# Patient Record
Sex: Male | Born: 1950
Health system: Southern US, Community
[De-identification: ages and names within clinical notes are randomized; demographics above are authoritative.]

## PROBLEM LIST (undated history)

## (undated) DIAGNOSIS — F1010 Alcohol abuse, uncomplicated: Secondary | ICD-10-CM

## (undated) DIAGNOSIS — F1410 Cocaine abuse, uncomplicated: Secondary | ICD-10-CM

## (undated) DIAGNOSIS — F419 Anxiety disorder, unspecified: Secondary | ICD-10-CM

## (undated) DIAGNOSIS — F1020 Alcohol dependence, uncomplicated: Secondary | ICD-10-CM

## (undated) DIAGNOSIS — I10 Essential (primary) hypertension: Secondary | ICD-10-CM

## (undated) DIAGNOSIS — F32A Depression, unspecified: Secondary | ICD-10-CM

## (undated) DIAGNOSIS — F329 Major depressive disorder, single episode, unspecified: Secondary | ICD-10-CM

---

## 2004-10-22 ENCOUNTER — Emergency Department (HOSPITAL_COMMUNITY): Admission: EM | Admit: 2004-10-22 | Discharge: 2004-10-23 | Payer: Self-pay | Admitting: Emergency Medicine

## 2005-02-05 ENCOUNTER — Emergency Department (HOSPITAL_COMMUNITY): Admission: EM | Admit: 2005-02-05 | Discharge: 2005-02-05 | Payer: Self-pay | Admitting: Emergency Medicine

## 2005-02-20 ENCOUNTER — Ambulatory Visit: Payer: Self-pay | Admitting: Family Medicine

## 2005-02-21 ENCOUNTER — Ambulatory Visit: Payer: Self-pay | Admitting: *Deleted

## 2005-09-30 ENCOUNTER — Ambulatory Visit: Payer: Self-pay | Admitting: Nurse Practitioner

## 2005-10-08 ENCOUNTER — Ambulatory Visit: Payer: Self-pay | Admitting: Internal Medicine

## 2005-11-11 ENCOUNTER — Ambulatory Visit: Payer: Self-pay | Admitting: Internal Medicine

## 2006-09-17 ENCOUNTER — Emergency Department (HOSPITAL_COMMUNITY): Admission: EM | Admit: 2006-09-17 | Discharge: 2006-09-17 | Payer: Self-pay | Admitting: Emergency Medicine

## 2007-06-17 ENCOUNTER — Emergency Department (HOSPITAL_COMMUNITY): Admission: EM | Admit: 2007-06-17 | Discharge: 2007-06-17 | Payer: Self-pay | Admitting: Emergency Medicine

## 2007-07-27 ENCOUNTER — Inpatient Hospital Stay (HOSPITAL_COMMUNITY): Admission: EM | Admit: 2007-07-27 | Discharge: 2007-07-29 | Payer: Self-pay | Admitting: Emergency Medicine

## 2009-02-18 ENCOUNTER — Emergency Department (HOSPITAL_COMMUNITY): Admission: EM | Admit: 2009-02-18 | Discharge: 2009-02-18 | Payer: Self-pay | Admitting: Emergency Medicine

## 2009-05-10 ENCOUNTER — Emergency Department (HOSPITAL_COMMUNITY): Admission: EM | Admit: 2009-05-10 | Discharge: 2009-05-10 | Payer: Self-pay | Admitting: Emergency Medicine

## 2009-05-17 ENCOUNTER — Emergency Department (HOSPITAL_COMMUNITY): Admission: EM | Admit: 2009-05-17 | Discharge: 2009-05-17 | Payer: Self-pay | Admitting: Emergency Medicine

## 2009-07-18 ENCOUNTER — Emergency Department (HOSPITAL_COMMUNITY): Admission: EM | Admit: 2009-07-18 | Discharge: 2009-07-18 | Payer: Self-pay | Admitting: Emergency Medicine

## 2010-08-20 ENCOUNTER — Emergency Department (HOSPITAL_COMMUNITY): Admission: EM | Admit: 2010-08-20 | Discharge: 2010-08-20 | Payer: Self-pay | Admitting: Emergency Medicine

## 2011-02-27 LAB — DIFFERENTIAL
Lymphocytes Relative: 12 % (ref 12–46)
Lymphs Abs: 1.5 10*3/uL (ref 0.7–4.0)
Monocytes Relative: 8 % (ref 3–12)
Neutrophils Relative %: 80 % — ABNORMAL HIGH (ref 43–77)

## 2011-02-27 LAB — CBC
Hemoglobin: 13.1 g/dL (ref 13.0–17.0)
Platelets: 237 10*3/uL (ref 150–400)
RBC: 4.74 MIL/uL (ref 4.22–5.81)
WBC: 12.1 10*3/uL — ABNORMAL HIGH (ref 4.0–10.5)

## 2011-02-27 LAB — RAPID URINE DRUG SCREEN, HOSP PERFORMED
Amphetamines: NOT DETECTED
Barbiturates: NOT DETECTED
Benzodiazepines: NOT DETECTED
Cocaine: POSITIVE — AB
Opiates: NOT DETECTED

## 2011-02-27 LAB — BASIC METABOLIC PANEL
Calcium: 9.2 mg/dL (ref 8.4–10.5)
Chloride: 102 mEq/L (ref 96–112)
Creatinine, Ser: 0.91 mg/dL (ref 0.4–1.5)
GFR calc Af Amer: >60 mL/min (ref >60)
Sodium: 139 mEq/L (ref 135–145)

## 2011-02-27 LAB — ETHANOL: Alcohol, Ethyl (B): 12 mg/dL — ABNORMAL HIGH (ref 0–10)

## 2011-03-24 LAB — CBC
MCHC: 32.2 g/dL (ref 30.0–36.0)
MCV: 84.7 fL (ref 78.0–100.0)
RBC: 5.61 MIL/uL (ref 4.22–5.81)

## 2011-03-24 LAB — RAPID URINE DRUG SCREEN, HOSP PERFORMED
Amphetamines: NOT DETECTED
Barbiturates: NOT DETECTED
Benzodiazepines: NOT DETECTED
Cocaine: POSITIVE — AB

## 2011-03-24 LAB — BASIC METABOLIC PANEL
BUN: 11 mg/dL (ref 6–23)
CO2: 25 mEq/L (ref 19–32)
Chloride: 103 mEq/L (ref 96–112)
Creatinine, Ser: 1.13 mg/dL (ref 0.4–1.5)
Glucose, Bld: 140 mg/dL — ABNORMAL HIGH (ref 70–99)

## 2011-03-24 LAB — DIFFERENTIAL
Basophils Absolute: 0 10*3/uL (ref 0.0–0.1)
Eosinophils Relative: 1 % (ref 0–5)
Lymphocytes Relative: 22 % (ref 12–46)
Lymphs Abs: 1.8 10*3/uL (ref 0.7–4.0)
Monocytes Absolute: 0.5 10*3/uL (ref 0.1–1.0)
Neutro Abs: 5.9 10*3/uL (ref 1.7–7.7)

## 2011-03-25 LAB — BASIC METABOLIC PANEL
BUN: 13 mg/dL (ref 6–23)
Chloride: 111 mEq/L (ref 96–112)
Creatinine, Ser: 1.22 mg/dL (ref 0.4–1.5)
Glucose, Bld: 120 mg/dL — ABNORMAL HIGH (ref 70–99)
Potassium: 4.2 mEq/L (ref 3.5–5.1)

## 2011-03-25 LAB — CBC
HCT: 40.7 % (ref 39.0–52.0)
MCHC: 32.5 g/dL (ref 30.0–36.0)
MCV: 84.6 fL (ref 78.0–100.0)
Platelets: 238 10*3/uL (ref 150–400)
RDW: 14.7 % (ref 11.5–15.5)
WBC: 6.8 10*3/uL (ref 4.0–10.5)

## 2011-03-25 LAB — DIFFERENTIAL
Basophils Absolute: 0 10*3/uL (ref 0.0–0.1)
Basophils Relative: 0 % (ref 0–1)
Eosinophils Absolute: 0 10*3/uL (ref 0.0–0.7)
Eosinophils Relative: 1 % (ref 0–5)
Lymphs Abs: 1.4 10*3/uL (ref 0.7–4.0)
Neutrophils Relative %: 72 % (ref 43–77)

## 2011-03-25 LAB — RAPID URINE DRUG SCREEN, HOSP PERFORMED
Benzodiazepines: NOT DETECTED
Cocaine: POSITIVE — AB
Tetrahydrocannabinol: NOT DETECTED

## 2011-03-25 LAB — ETHANOL: Alcohol, Ethyl (B): <5 mg/dL (ref 0–10)

## 2011-03-25 LAB — TRICYCLICS SCREEN, URINE: TCA Scrn: NOT DETECTED

## 2011-03-27 LAB — BASIC METABOLIC PANEL
BUN: 10 mg/dL (ref 6–23)
Chloride: 102 mEq/L (ref 96–112)
Creatinine, Ser: 0.93 mg/dL (ref 0.4–1.5)
GFR calc non Af Amer: >60 mL/min (ref >60)
Glucose, Bld: 90 mg/dL (ref 70–99)

## 2011-03-27 LAB — DIFFERENTIAL
Basophils Absolute: 0 10*3/uL (ref 0.0–0.1)
Eosinophils Absolute: 0.1 10*3/uL (ref 0.0–0.7)
Lymphs Abs: 2.1 10*3/uL (ref 0.7–4.0)
Neutrophils Relative %: 73 % (ref 43–77)

## 2011-03-27 LAB — CBC
MCV: 84.4 fL (ref 78.0–100.0)
Platelets: 230 10*3/uL (ref 150–400)
RDW: 13.9 % (ref 11.5–15.5)
WBC: 11.8 10*3/uL — ABNORMAL HIGH (ref 4.0–10.5)
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
## 2011-08-18 ENCOUNTER — Emergency Department (HOSPITAL_COMMUNITY)
Admission: EM | Admit: 2011-08-18 | Discharge: 2011-08-18 | Disposition: A | Discharge status: Home / Self Care | Payer: Self-pay | Attending: Emergency Medicine | Admitting: Emergency Medicine

## 2011-08-18 ENCOUNTER — Emergency Department (HOSPITAL_COMMUNITY): Payer: Self-pay

## 2011-08-18 DIAGNOSIS — K6289 Other specified diseases of anus and rectum: Secondary | ICD-10-CM | POA: Insufficient documentation

## 2011-08-18 DIAGNOSIS — R109 Unspecified abdominal pain: Secondary | ICD-10-CM | POA: Insufficient documentation

## 2011-08-18 DIAGNOSIS — K644 Residual hemorrhoidal skin tags: Secondary | ICD-10-CM | POA: Insufficient documentation

## 2011-08-18 DIAGNOSIS — K625 Hemorrhage of anus and rectum: Secondary | ICD-10-CM | POA: Insufficient documentation

## 2011-08-18 DIAGNOSIS — K5900 Constipation, unspecified: Secondary | ICD-10-CM | POA: Insufficient documentation

## 2011-08-18 LAB — OCCULT BLOOD, POC DEVICE: Fecal Occult Bld: NEGATIVE

## 2011-09-29 LAB — DIFFERENTIAL
Basophils Absolute: 0
Basophils Relative: 0
Eosinophils Relative: 1
Monocytes Absolute: 0.6
Monocytes Relative: 9

## 2011-09-29 LAB — CBC
HCT: 39
Platelets: 235
RDW: 14.1 — ABNORMAL HIGH
WBC: 6.8

## 2011-09-29 LAB — COMPREHENSIVE METABOLIC PANEL
ALT: 21
AST: 29
Albumin: 3.2 — ABNORMAL LOW
Alkaline Phosphatase: 47
BUN: 7
Chloride: 103
GFR calc Af Amer: >60
Potassium: 3.7
Sodium: 139
Total Bilirubin: 0.4
Total Protein: 7.1

## 2011-09-29 LAB — APTT: aPTT: 27

## 2012-06-28 ENCOUNTER — Emergency Department (HOSPITAL_COMMUNITY)
Admission: EM | Admit: 2012-06-28 | Discharge: 2012-06-28 | Disposition: A | Discharge status: Home / Self Care | Payer: Self-pay | Attending: Emergency Medicine | Admitting: Emergency Medicine

## 2012-06-28 ENCOUNTER — Encounter (HOSPITAL_COMMUNITY): Payer: Self-pay | Admitting: Emergency Medicine

## 2012-06-28 ENCOUNTER — Emergency Department (HOSPITAL_COMMUNITY): Payer: Self-pay

## 2012-06-28 DIAGNOSIS — Y9289 Other specified places as the place of occurrence of the external cause: Secondary | ICD-10-CM | POA: Insufficient documentation

## 2012-06-28 DIAGNOSIS — W19XXXA Unspecified fall, initial encounter: Secondary | ICD-10-CM | POA: Insufficient documentation

## 2012-06-28 DIAGNOSIS — S2231XA Fracture of one rib, right side, initial encounter for closed fracture: Secondary | ICD-10-CM

## 2012-06-28 DIAGNOSIS — F17200 Nicotine dependence, unspecified, uncomplicated: Secondary | ICD-10-CM | POA: Insufficient documentation

## 2012-06-28 DIAGNOSIS — R079 Chest pain, unspecified: Secondary | ICD-10-CM | POA: Insufficient documentation

## 2012-06-28 MED ORDER — HYDROCODONE-ACETAMINOPHEN 5-500 MG PO TABS: 1.0000 | ORAL_TABLET | Freq: Four times a day (QID) | ORAL | Status: AC | PRN

## 2012-06-28 MED ORDER — HYDROCODONE-ACETAMINOPHEN 5-325 MG PO TABS
2.0000 | ORAL_TABLET | Freq: Once | ORAL | Status: AC
  Administered 2012-06-28: 2 via ORAL
  Filled 2012-06-28: qty 2

## 2012-06-28 NOTE — ED Provider Notes (Signed)
History    This chart was scribed for Suzi Roots, MD, MD by Smitty Pluck. The patient was seen in room TR05C and the patient's care was started at 3:12PM.   CSN: [id number]  Arrival date & time 06/28/12  1426   None     Chief Complaint  Patient presents with  . Fall    (Consider location/radiation/quality/duration/timing/severity/associated sxs/prior treatment) Patient is a 61 y.o. male presenting with fall. The history is provided by the patient.  Fall Pertinent negatives include no fever, no numbness, no abdominal pain, no nausea, no vomiting and no headaches.   Ryan Herring is a 61 y.o. male who presents to the Emergency Department complaining of constant moderate right rib pain onset 2 days ago after falling in parking lot. Constant, dull, worse w palpation and movement. Denies neck and back pain. Denies abdominal pain. No nv. No sob. Pt denies taking medication PTA. Denies head injury or LOC. Denies radiation. Pt states trip and fall in parking lot 2 nights ago. No loc. No faintness or dizziness.   History reviewed. No pertinent past medical history.  History reviewed. No pertinent past surgical history.  History reviewed. No pertinent family history.  History  Substance Use Topics  . Smoking status: Current Everyday Smoker  . Smokeless tobacco: Not on file  . Alcohol Use: Yes      Review of Systems  Constitutional: Negative for fever and chills.  HENT: Negative for neck pain.   Respiratory: Negative for cough and shortness of breath.   Gastrointestinal: Negative for nausea, vomiting and abdominal pain.  Genitourinary: Negative for flank pain.  Musculoskeletal: Negative for back pain.  Skin: Negative for wound.  Neurological: Negative for weakness, numbness and headaches.    Allergies  Review of patient's allergies indicates no known allergies.  Home Medications  No current outpatient prescriptions on file.  BP 132/90  Pulse 77  Temp 98 F (36.7 C)  (Oral)  Resp 18  SpO2 96%  Physical Exam  Nursing note and vitals reviewed. Constitutional: He is oriented to person, place, and time. He appears well-developed and well-nourished. No distress.  HENT:  Head: Normocephalic and atraumatic.  Eyes: Conjunctivae are normal.  Neck: Neck supple. No tracheal deviation present.  Cardiovascular: Normal rate, regular rhythm, normal heart sounds and intact distal pulses.   Pulmonary/Chest: Effort normal and breath sounds normal. No respiratory distress. He exhibits tenderness (left lateral ribs).       Right lateral chest wall tenderness. No crepitus.   Abdominal: Soft. Bowel sounds are normal. He exhibits no distension. There is no tenderness.       No abd wall contusion or bruising noted.   Musculoskeletal: Normal range of motion. He exhibits no edema and no tenderness.       CTLS spine, non tender, aligned, no step off.   Neurological: He is alert and oriented to person, place, and time.       Steady gait.   Skin: Skin is warm and dry.  Psychiatric: He has a normal mood and affect. His behavior is normal.    ED Course  Procedures (including critical care time) DIAGNOSTIC STUDIES: Oxygen Saturation is 96% on room air, normal by my interpretation.    COORDINATION OF CARE: 3:22PM EDP ordered medication: norco 325 mg   Results for orders placed during the hospital encounter of 08/18/11  OCCULT BLOOD, POC DEVICE      Component Value Range   Fecal Occult Bld NEGATIVE  Dg Ribs Unilateral W/chest Right  06/28/2012  *RADIOLOGY REPORT*  Clinical Data: Fall with right rib pain.  RIGHT RIBS AND CHEST - 3+ VIEW  Comparison: 02/18/2009.  Findings: Trachea is midline.  Heart is enlarged, stable.  Minimal left basilar scarring.  No pleural fluid.  Two views of the right ribs show a nondisplaced fracture of the right 9th anterolateral rib.  IMPRESSION: Nondisplaced right 9th anterolateral rib fracture.  Original Report Authenticated By: Reyes Ivan, M.D.       MDM  I personally performed the services described in this documentation, which was scribed in my presence. The recorded information has been reviewed and considered. Suzi Roots, MD  Pt says took bus, does not have to drive. No meds pta. vicodin po. Xrays.       Suzi Roots, MD 06/28/12 [phone]

## 2012-06-28 NOTE — ED Notes (Signed)
Pt c/o right rib pain after falling on Saturday night; pt denies other injury

## 2012-10-27 ENCOUNTER — Encounter (HOSPITAL_BASED_OUTPATIENT_CLINIC_OR_DEPARTMENT_OTHER): Payer: Self-pay | Admitting: Family Medicine

## 2012-10-27 ENCOUNTER — Emergency Department (HOSPITAL_BASED_OUTPATIENT_CLINIC_OR_DEPARTMENT_OTHER)
Admission: EM | Admit: 2012-10-27 | Discharge: 2012-10-27 | Disposition: A | Discharge status: Home / Self Care | Payer: Self-pay | Attending: Emergency Medicine | Admitting: Emergency Medicine

## 2012-10-27 DIAGNOSIS — F17200 Nicotine dependence, unspecified, uncomplicated: Secondary | ICD-10-CM | POA: Insufficient documentation

## 2012-10-27 DIAGNOSIS — Z79899 Other long term (current) drug therapy: Secondary | ICD-10-CM | POA: Insufficient documentation

## 2012-10-27 DIAGNOSIS — R350 Frequency of micturition: Secondary | ICD-10-CM | POA: Insufficient documentation

## 2012-10-27 LAB — COMPREHENSIVE METABOLIC PANEL
Alkaline Phosphatase: 60 U/L (ref 39–117)
BUN: 14 mg/dL (ref 6–23)
Chloride: 103 mEq/L (ref 96–112)
Creatinine, Ser: 0.9 mg/dL (ref 0.50–1.35)
GFR calc Af Amer: >90 mL/min (ref >90)
GFR calc non Af Amer: >90 mL/min (ref >90)
Glucose, Bld: 120 mg/dL — ABNORMAL HIGH (ref 70–99)
Potassium: 3.8 mEq/L (ref 3.5–5.1)
Total Bilirubin: 0.1 mg/dL — ABNORMAL LOW (ref 0.3–1.2)

## 2012-10-27 LAB — URINALYSIS, ROUTINE W REFLEX MICROSCOPIC
Bilirubin Urine: NEGATIVE
Glucose, UA: NEGATIVE mg/dL
Ketones, ur: NEGATIVE mg/dL
Leukocytes, UA: NEGATIVE
Nitrite: NEGATIVE
Specific Gravity, Urine: 1.026 (ref 1.005–1.030)
pH: 5 (ref 5.0–8.0)

## 2012-10-27 LAB — CBC WITH DIFFERENTIAL/PLATELET
HCT: 34.4 % — ABNORMAL LOW (ref 39.0–52.0)
Hemoglobin: 11.2 g/dL — ABNORMAL LOW (ref 13.0–17.0)
Lymphs Abs: 1.6 10*3/uL (ref 0.7–4.0)
MCH: 26.7 pg (ref 26.0–34.0)
MCHC: 32.6 g/dL (ref 30.0–36.0)
Monocytes Absolute: 0.7 10*3/uL (ref 0.1–1.0)
Monocytes Relative: 11 % (ref 3–12)
Neutro Abs: 3.9 10*3/uL (ref 1.7–7.7)
Neutrophils Relative %: 62 % (ref 43–77)
RBC: 4.2 MIL/uL — ABNORMAL LOW (ref 4.22–5.81)

## 2012-10-27 MED ORDER — PREDNISONE 10 MG PO TABS: 20.0000 mg | ORAL_TABLET | Freq: Two times a day (BID) | ORAL | Status: DC

## 2012-10-27 NOTE — ED Provider Notes (Signed)
History     CSN: [id number]  Arrival date & time 10/27/12  1310   First MD Initiated Contact with Patient 10/27/12 1331      Chief Complaint  Patient presents with  . Urinary Frequency    (Consider location/radiation/quality/duration/timing/severity/associated sxs/prior treatment) HPI Comments: Patient is at [facility] for treatment of drugs and alcohol abuse.  He presents here with several complaints.  He says he is having urinary frequency without dysuria and is having to get up several times per night to urinate.  No fevers or chills.  He is also been unable to achieve orgasm while he has been at [facility].  He is on Prozac but tells me he has been on this for several years.  He is also having sores on his skin that itch.  He has had these for many years as well.    Patient is a 61 y.o. male presenting with frequency. The history is provided by the patient.  Urinary Frequency This is a new problem. Episode onset: several weeks ago. The problem occurs constantly. The problem has not changed since onset.Pertinent negatives include no chest pain and no abdominal pain. Nothing aggravates the symptoms. Nothing relieves the symptoms. He has tried nothing for the symptoms.    History reviewed. No pertinent past medical history.  History reviewed. No pertinent past surgical history.  No family history on file.  History  Substance Use Topics  . Smoking status: Current Every Day Smoker  . Smokeless tobacco: Not on file  . Alcohol Use: Yes     Comment: in rehab      Review of Systems  Cardiovascular: Negative for chest pain.  Gastrointestinal: Negative for abdominal pain.  Genitourinary: Positive for frequency.  All other systems reviewed and are negative.    Allergies  Review of patient's allergies indicates no known allergies.  Home Medications   Current Outpatient Rx  Name  Route  Sig  Dispense  Refill  . FLUOXETINE HCL 20 MG PO TABS   Oral   Take 60 mg by mouth daily.         . TRAZODONE HCL 100 MG PO TABS   Oral   Take 100 mg by mouth at bedtime.           BP 126/83  Pulse 75  Temp 97.6 F (36.4 C) (Oral)  Resp 16  Ht 6\' 5"  (1.956 m)  Wt 228 lb (103.42 kg)  BMI 27.04 kg/m2  SpO2 98%  Physical Exam  Nursing note and vitals reviewed. Constitutional: He is oriented to person, place, and time. He appears well-developed and well-nourished. No distress.  HENT:  Head: Normocephalic and atraumatic.  Mouth/Throat: Oropharynx is clear and moist.  Neck: Normal range of motion. Neck supple.  Cardiovascular: Normal rate and regular rhythm.   No murmur heard. Pulmonary/Chest: Effort normal and breath sounds normal. No respiratory distress.  Abdominal: Soft. Bowel sounds are normal. He exhibits no distension. There is no tenderness.  Musculoskeletal: Normal range of motion.  Neurological: He is alert and oriented to person, place, and time.  Skin: Skin is warm and dry. He is not diaphoretic.       There are multiple excoriated areas with dry, flaking skin to the arms and legs.      ED Course  Procedures (including critical care time)   Labs Reviewed  URINALYSIS, ROUTINE W REFLEX MICROSCOPIC  CBC WITH DIFFERENTIAL  COMPREHENSIVE METABOLIC PANEL   No results found.   No diagnosis found.  MDM  The labs do not reveal elevated blood sugar.  There is no uti.  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  Will treat with prednisone.  As far as the anorgasmia goes, this could be related to prozac.  He really needs a pcp to discuss these issues.  He may also have some prostate issues that should be worked up in the office.          Geoffery Lyons, MD 10/27/12 [phone]

## 2012-10-27 NOTE — ED Notes (Signed)
Pt is in [facility]. Pt c/o having to get up 3-4 times at night to urinate and rash to bilateral lower extremities for "over a year". Pt also sts "when I'm making love, I can't get satisfied".

## 2013-11-10 ENCOUNTER — Emergency Department (HOSPITAL_COMMUNITY): Payer: Self-pay

## 2013-11-10 ENCOUNTER — Emergency Department (HOSPITAL_COMMUNITY)
Admission: EM | Admit: 2013-11-10 | Discharge: 2013-11-10 | Disposition: A | Discharge status: Home / Self Care | Payer: Self-pay | Attending: Emergency Medicine | Admitting: Emergency Medicine

## 2013-11-10 ENCOUNTER — Encounter (HOSPITAL_COMMUNITY): Payer: Self-pay | Admitting: Emergency Medicine

## 2013-11-10 DIAGNOSIS — R072 Precordial pain: Secondary | ICD-10-CM | POA: Insufficient documentation

## 2013-11-10 DIAGNOSIS — I10 Essential (primary) hypertension: Secondary | ICD-10-CM | POA: Insufficient documentation

## 2013-11-10 DIAGNOSIS — F17200 Nicotine dependence, unspecified, uncomplicated: Secondary | ICD-10-CM | POA: Insufficient documentation

## 2013-11-10 DIAGNOSIS — R079 Chest pain, unspecified: Secondary | ICD-10-CM

## 2013-11-10 DIAGNOSIS — Z79899 Other long term (current) drug therapy: Secondary | ICD-10-CM | POA: Insufficient documentation

## 2013-11-10 LAB — BASIC METABOLIC PANEL
CO2: 28 mEq/L (ref 19–32)
Chloride: 102 mEq/L (ref 96–112)
Creatinine, Ser: 0.81 mg/dL (ref 0.50–1.35)
GFR calc Af Amer: >90 mL/min (ref >90)
Potassium: 3.7 mEq/L (ref 3.5–5.1)
Sodium: 137 mEq/L (ref 135–145)

## 2013-11-10 LAB — CBC
Platelets: 260 10*3/uL (ref 150–400)
RBC: 4.4 MIL/uL (ref 4.22–5.81)
RDW: 14 % (ref 11.5–15.5)
WBC: 7.9 10*3/uL (ref 4.0–10.5)

## 2013-11-10 LAB — POCT I-STAT TROPONIN I: Troponin i, poc: 0.01 ng/mL (ref 0.00–0.08)

## 2013-11-10 MED ORDER — HYDROCHLOROTHIAZIDE 25 MG PO TABS: 25.0000 mg | ORAL_TABLET | Freq: Every day | ORAL | Status: DC

## 2013-11-10 MED ORDER — OXYCODONE-ACETAMINOPHEN 5-325 MG PO TABS
1.0000 | ORAL_TABLET | Freq: Once | ORAL | Status: AC
  Administered 2013-11-10: 1 via ORAL
  Filled 2013-11-10: qty 1

## 2013-11-10 NOTE — ED Provider Notes (Signed)
I saw and evaluated the patient, reviewed the resident's note and I agree with the findings and plan.  EKG Interpretation    Date/Time:  Thursday November 10 2013 19:49:03 EST Ventricular Rate:  74 PR Interval:  176 QRS Duration: 86 QT Interval:  432 QTC Calculation: 479 R Axis:   -26 Text Interpretation:  Sinus rhythm Atrial premature complexes Left ventricular hypertrophy Borderline prolonged QT interval No significant change since last tracing Confirmed by Anitra Lauth  MD, Jaquila Santelli ([id number]) on 11/10/2013 8:03:57 PM            Pt with atypical story for CP.  Only risk factor HTN.  No recent cocaine use.  Very low risk for PE.  States pain startes when he drinks beer.  EKG wnl.  CXR, CBC, BMP, troponin neg (after 9 hours of pain).  No improvement with NTG from EMS which is not suprising due to this does not sound like cardiac CP.  Improvement after percocet and pt d/ced home with med refill.   Gwyneth Sprout, MD 11/10/13 2250

## 2013-11-10 NOTE — ED Notes (Signed)
Patient with chest pain that started this morning.  Patient was given 324mg  of ASA and 2 SL nitro en route to ED.  Patient continues with pain, 6/10 after nitro.  Patient denies any shortness of breath.

## 2013-11-10 NOTE — ED Notes (Signed)
Patient removed IV

## 2013-11-10 NOTE — ED Notes (Signed)
Pt states the stretcher is not comfortable, pt informed there are only stretchers in the ED. Bed readjusted and pt given extra pillow.

## 2013-11-10 NOTE — ED Provider Notes (Signed)
CSN: [id number]     Arrival date & time 11/10/13  1938 History   First MD Initiated Contact with Patient 11/10/13 1947     Chief Complaint  Patient presents with  . Chest Pain   (Consider location/radiation/quality/duration/timing/severity/associated sxs/prior Treatment) HPI Comments: 62 year old male with chest pain. States this began at 12:00 PM. 7 hours prior to arrival. Has been a constant pain. This is a pressure on the right side of his chest. Patient states this did come on when he got very nervous.Minimal relief with nitroglycerin. Patient denies any shortness of breath diaphoresis. Patient is nondiabetic has no known hyperlipidemia. Previous diagnosed hypertension. No previous heart issues. No family history.  Patient is a 62 y.o. male presenting with chest pain. The history is provided by the patient.  Chest Pain Pain location:  Substernal area and R chest Pain quality: aching   Pain radiates to:  Does not radiate Pain radiates to the back: no   Pain severity:  Moderate Onset quality:  Unable to specify Duration:  6 hours Timing:  Constant Progression:  Partially resolved Chronicity:  New Context: at rest   Relieved by:  Nothing Worsened by:  Nothing tried Ineffective treatments:  Nitroglycerin and aspirin Associated symptoms: no abdominal pain, no back pain, no fatigue, no headache and no shortness of breath     History reviewed. No pertinent past medical history. History reviewed. No pertinent past surgical history. No family history on file. History  Substance Use Topics  . Smoking status: Current Every Day Smoker  . Smokeless tobacco: Not on file  . Alcohol Use: Yes     Comment: in rehab    Review of Systems  Constitutional: Negative for fatigue.  Respiratory: Negative for shortness of breath.   Cardiovascular: Positive for chest pain.  Gastrointestinal: Negative for abdominal pain.  Genitourinary: Negative for dysuria.  Musculoskeletal: Negative for back  pain.  Skin: Negative for rash.  Neurological: Negative for headaches.  Psychiatric/Behavioral: Negative for agitation.  All other systems reviewed and are negative.    Allergies  Review of patient's allergies indicates no known allergies.  Home Medications   Current Outpatient Rx  Name  Route  Sig  Dispense  Refill  . ARIPiprazole (ABILIFY) 20 MG tablet   Oral   Take 20 mg by mouth at bedtime.         . hydrochlorothiazide (HYDRODIURIL) 25 MG tablet   Oral   Take 1 tablet (25 mg total) by mouth daily.   30 tablet   1    BP 114/77  Pulse 71  Temp(Src) 98.2 F (36.8 C) (Oral)  Resp 14  SpO2 96% Physical Exam  Nursing note and vitals reviewed. Constitutional: He is oriented to person, place, and time. He appears well-developed and well-nourished.  HENT:  Head: Normocephalic and atraumatic.  Eyes: EOM are normal. Pupils are equal, round, and reactive to light.  Neck: Normal range of motion.  Cardiovascular: Normal rate, regular rhythm, normal heart sounds and intact distal pulses.   No murmur heard. Pulmonary/Chest: Effort normal and breath sounds normal. No respiratory distress. He exhibits no tenderness.  Abdominal: Soft. He exhibits no distension. There is no tenderness.  Musculoskeletal: Normal range of motion.  Neurological: He is alert and oriented to person, place, and time. No cranial nerve deficit. He exhibits normal muscle tone. Coordination normal.  Skin: Skin is warm and dry. No rash noted.  Psychiatric: He has a normal mood and affect.    ED Course  Procedures (including  critical care time) Labs Review Labs Reviewed  CBC - Abnormal; Notable for the following:    Hemoglobin 12.0 (*)    HCT 36.2 (*)    All other components within normal limits  BASIC METABOLIC PANEL  POCT I-STAT TROPONIN I   Imaging Review Dg Chest 2 View  11/10/2013   CLINICAL DATA:  Right-sided chest pain  EXAM: CHEST  2 VIEW  COMPARISON:  Prior radiograph from 06/28/2012   FINDINGS: The cardiac and mediastinal silhouettes are stable in size and contour, and remain within normal limits.  The lungs are normally inflated. No airspace consolidation, pleural effusion, or pulmonary edema is identified. There is no pneumothorax.  No acute osseous abnormality identified.  IMPRESSION: No active cardiopulmonary disease.   Electronically Signed   By: Rise Mu M.D.   On: 11/10/2013 21:56    EKG Interpretation    Date/Time:  Thursday November 10 2013 19:49:03 EST Ventricular Rate:  74 PR Interval:  176 QRS Duration: 86 QT Interval:  432 QTC Calculation: 479 R Axis:   -26 Text Interpretation:  Sinus rhythm Atrial premature complexes Left ventricular hypertrophy Borderline prolonged QT interval No significant change since last tracing Confirmed by Anitra Lauth  MD, WHITNEY ([id number]) on 11/10/2013 8:03:57 PM            MDM   1. Chest pain    62 year old male arrives with chest pain. Patient received nitroglycerin and aspirin 325 with EMS. Minimal relief of symptoms. Patient signs symptoms not consistent with cardiac chest pain. Pain is right-sided. Not relieved with nitroglycerin. EKG findings above. However no concerning signs of ischemia. Initial troponin negative. Patient states he often gets this pain while drinking. Patient admits to drinking this afternoon. Chest has been present for over 6 hours, patient states has been constant for 6 hours. If cardiac expected elevated troponin 6 hours in. Vital signs stable. Patient on short of breath. No hypoxic or tachycardic. Doubt pulmonary embolism. Physical exam unremarkable. Chest x-ray showed no signs of infectious cause. Pain not consistent with aortic pathology. Believe pt appropriate for discharge. Pt given strict return precautions for worsening pain, SOB, diaphoresis etc. Pt voices understanding. Patient also with a history of hypertension. Has not been taking his medications for several months does not have  refills. Will provide prescription for hydrochlorothiazide.  Patient discussed with attending Dr. Anitra Lauth.      Bridgett Larsson, MD 11/10/13 2245

## 2013-11-29 ENCOUNTER — Emergency Department (HOSPITAL_COMMUNITY)
Admission: EM | Admit: 2013-11-29 | Discharge: 2013-11-29 | Disposition: A | Discharge status: Home / Self Care | Payer: Self-pay | Attending: Emergency Medicine | Admitting: Emergency Medicine

## 2013-11-29 ENCOUNTER — Encounter (HOSPITAL_COMMUNITY): Payer: Self-pay | Admitting: Emergency Medicine

## 2013-11-29 DIAGNOSIS — R404 Transient alteration of awareness: Secondary | ICD-10-CM | POA: Insufficient documentation

## 2013-11-29 DIAGNOSIS — K117 Disturbances of salivary secretion: Secondary | ICD-10-CM | POA: Insufficient documentation

## 2013-11-29 DIAGNOSIS — Z79899 Other long term (current) drug therapy: Secondary | ICD-10-CM | POA: Insufficient documentation

## 2013-11-29 DIAGNOSIS — F329 Major depressive disorder, single episode, unspecified: Secondary | ICD-10-CM | POA: Insufficient documentation

## 2013-11-29 DIAGNOSIS — F3289 Other specified depressive episodes: Secondary | ICD-10-CM | POA: Insufficient documentation

## 2013-11-29 DIAGNOSIS — I10 Essential (primary) hypertension: Secondary | ICD-10-CM | POA: Insufficient documentation

## 2013-11-29 DIAGNOSIS — F411 Generalized anxiety disorder: Secondary | ICD-10-CM | POA: Insufficient documentation

## 2013-11-29 DIAGNOSIS — F17200 Nicotine dependence, unspecified, uncomplicated: Secondary | ICD-10-CM | POA: Insufficient documentation

## 2013-11-29 DIAGNOSIS — F419 Anxiety disorder, unspecified: Secondary | ICD-10-CM

## 2013-11-29 HISTORY — DX: Anxiety disorder, unspecified: F41.9

## 2013-11-29 HISTORY — DX: Essential (primary) hypertension: I10

## 2013-11-29 HISTORY — DX: Major depressive disorder, single episode, unspecified: F32.9

## 2013-11-29 HISTORY — DX: Depression, unspecified: F32.A

## 2013-11-29 MED ORDER — ALPRAZOLAM 0.5 MG PO TABS
0.5000 mg | ORAL_TABLET | Freq: Once | ORAL | Status: AC
  Administered 2013-11-29: 0.5 mg via ORAL
  Filled 2013-11-29: qty 1

## 2013-11-29 MED ORDER — ALPRAZOLAM 0.5 MG PO TABS: 0.5000 mg | ORAL_TABLET | Freq: Every evening | ORAL | Status: DC | PRN

## 2013-11-29 NOTE — ED Provider Notes (Signed)
CSN: [id number]     Arrival date & time 11/29/13  1310 History  This chart was scribed for non-physician practitioner Irish Elders, NP working with Ross Marcus, MD by Danella Maiers, ED Scribe. This patient was seen in room WTR8/WTR8 and the patient's care was started at 3:32 PM.    Chief Complaint  Patient presents with  . Anxiety   The history is provided by the patient. No language interpreter was used.   HPI Comments: Ryan Herring is a 62 y.o. male brought in by ambulance with a h/o anxiety, depression who presents to the Emergency Department complaining of anxiety, dry mouth, and drowsiness onset one week ago. He takes medicine for depression  - Abilify and Lexapro, every morning. He denies changes in the doses of these medications recently. He denies fevers, recent illness, SI, HI. He states he was told by a doctor that he has an irregular heart rhythm. He is otherwise healthy.    Past Medical History  Diagnosis Date  . Anxiety   . Hypertension   . Depression    No past surgical history on file. No family history on file. History  Substance Use Topics  . Smoking status: Current Every Day Smoker  . Smokeless tobacco: Not on file  . Alcohol Use: Yes     Comment: in rehab    Review of Systems  Psychiatric/Behavioral: Negative for suicidal ideas. The patient is nervous/anxious.   All other systems reviewed and are negative.    Allergies  Review of patient's allergies indicates no known allergies.  Home Medications   Current Outpatient Rx  Name  Route  Sig  Dispense  Refill  . ARIPiprazole (ABILIFY) 20 MG tablet   Oral   Take 20 mg by mouth at bedtime.         . hydrochlorothiazide (HYDRODIURIL) 25 MG tablet   Oral   Take 1 tablet (25 mg total) by mouth daily.   30 tablet   1    BP 137/99  Pulse 77  Temp(Src) 98.2 F (36.8 C) (Oral)  Resp 14  SpO2 94% Physical Exam  Nursing note and vitals reviewed. Constitutional: He is oriented to person, place,  and time. He appears well-developed and well-nourished. No distress.  HENT:  Head: Normocephalic and atraumatic.  Eyes: EOM are normal.  Neck: Neck supple. No tracheal deviation present.  Cardiovascular: Normal rate.   No murmur heard. Irregular rhythm.   Pulmonary/Chest: Effort normal. No respiratory distress.  Musculoskeletal: Normal range of motion.  Neurological: He is alert and oriented to person, place, and time.  Skin: Skin is warm and dry.  Psychiatric: He has a normal mood and affect. His behavior is normal.    ED Course  Procedures (including critical care time) Medications  ALPRAZolam (XANAX) tablet 0.5 mg (0.5 mg Oral Given 11/29/13 1603)    DIAGNOSTIC STUDIES: Oxygen Saturation is 94% on RA, adequate by my interpretation.    COORDINATION OF CARE: 3:56 PM- Discussed treatment plan with pt which includes EKG and one dose of Xanax. Pt agrees to plan.    Labs Review Labs Reviewed - No data to display Imaging Review No results found.  EKG Interpretation   None            1. Anxiety    Pt feeling anxious today. Denies chest pain, shortness of breath or difficulty breathing. No history of recent illness or travel. Feeling better after xanax 0.5mg  given here in ER. No suicidal or homicidal ideation. No  psychosis. Pt encouraged to establsih and follow-up with PCP for management of meds. VS stable, pt feeling ok to go home.   I personally performed the services described in this documentation, which was scribed in my presence. The recorded information has been reviewed and is accurate.    Irish Elders, NP 11/30/13 [phone]

## 2013-11-29 NOTE — ED Notes (Signed)
Pt c/o anxiety x 1 wk.  Denies SI/HI.

## 2013-11-29 NOTE — Progress Notes (Signed)
[facility] spoke with patient. Patient stated that he was homeless and went to the [facility]. Provided pt with a list of primary care resources, highlighting contact information for [facility]. Also, explained to patient about the [facility] [employer].

## 2013-11-29 NOTE — ED Notes (Signed)
Per EMS, states anxiety for 3 days-homeless-EMS was called out last night for same symptoms but did not come in for eval

## 2013-11-30 NOTE — ED Provider Notes (Deleted)
CSN: [id number]     Arrival date & time 11/29/13  1310 History   First MD Initiated Contact with Patient 11/29/13 1532     Chief Complaint  Patient presents with  . Anxiety   (Consider location/radiation/quality/duration/timing/severity/associated sxs/prior Treatment) Patient is a 62 y.o. male presenting with anxiety. The history is provided by the patient. No language interpreter was used.  Anxiety This is a recurrent problem. Pertinent negatives include no abdominal pain, chest pain, chills, fatigue, fever, joint swelling or vomiting.    Past Medical History  Diagnosis Date  . Anxiety   . Hypertension   . Depression    No past surgical history on file. No family history on file. History  Substance Use Topics  . Smoking status: Current Every Day Smoker  . Smokeless tobacco: Not on file  . Alcohol Use: Yes     Comment: in rehab    Review of Systems  Constitutional: Negative for fever, chills and fatigue.  Respiratory: Negative for chest tightness and shortness of breath.   Cardiovascular: Negative for chest pain.  Gastrointestinal: Negative for vomiting and abdominal pain.  Musculoskeletal: Negative for joint swelling.  Neurological: Negative for syncope and speech difficulty.  All other systems reviewed and are negative.    Allergies  Review of patient's allergies indicates no known allergies.  Home Medications   Current Outpatient Rx  Name  Route  Sig  Dispense  Refill  . ARIPiprazole (ABILIFY) 20 MG tablet   Oral   Take 20 mg by mouth daily.          . Aspirin-Caffeine (BC FAST PAIN RELIEF ARTHRITIS) 1000-65 MG PACK   Oral   Take 1 Package by mouth 2 (two) times daily as needed (pain).         Marland Kitchen escitalopram (LEXAPRO) 10 MG tablet   Oral   Take 10 mg by mouth daily.         . hydrochlorothiazide (HYDRODIURIL) 25 MG tablet   Oral   Take 1 tablet (25 mg total) by mouth daily.   30 tablet   1   . ibuprofen (ADVIL,MOTRIN) 100 MG tablet   Oral  Take 200 mg by mouth every 6 (six) hours as needed for fever (pain).         Marland Kitchen loratadine (CLARITIN) 10 MG tablet   Oral   Take 10 mg by mouth daily.         Marland Kitchen ALPRAZolam (XANAX) 0.5 MG tablet   Oral   Take 1 tablet (0.5 mg total) by mouth at bedtime as needed for anxiety.   20 tablet   0    BP 137/99  Pulse 77  Temp(Src) 98.2 F (36.8 C) (Oral)  Resp 14  SpO2 94% Physical Exam  Nursing note and vitals reviewed. Constitutional: He is oriented to person, place, and time. He appears well-developed and well-nourished. No distress.  HENT:  Head: Normocephalic and atraumatic.  Mouth/Throat: Oropharynx is clear and moist.  Eyes: Conjunctivae and EOM are normal. Pupils are equal, round, and reactive to light.  Neck: Normal range of motion. Neck supple. No JVD present. No thyromegaly present.  Cardiovascular: Normal rate, regular rhythm, normal heart sounds and intact distal pulses.   Pulmonary/Chest: Effort normal and breath sounds normal. No respiratory distress. He has no wheezes.  Abdominal: Soft. Bowel sounds are normal. He exhibits no distension. There is no tenderness.  Musculoskeletal: Normal range of motion.  Neurological: He is alert and oriented to person, place, and time.  Skin: Skin is warm and dry.  Psychiatric: His speech is normal and behavior is normal. Judgment and thought content normal. His mood appears anxious. Thought content is not paranoid. Cognition and memory are normal. He expresses no homicidal and no suicidal ideation.    ED Course  Procedures (including critical care time) Labs Review Labs Reviewed - No data to display Imaging Review No results found.  EKG Interpretation    Date/Time:  Tuesday November 29 2013 16:09:15 EST Ventricular Rate:  73 PR Interval:  188 QRS Duration: 86 QT Interval:  403 QTC Calculation: 444 R Axis:   -40 Text Interpretation:  Sinus rhythm Abnormal R-wave progression, early transition Left ventricular hypertrophy  Confirmed by HORTON  MD, COURTNEY ([id number]) on 11/29/2013 5:20:19 PM            MDM   1. Anxiety     Pt feeling anxious today. Denies chest pain, shortness of breath or difficulty breathing. No history of recent illness or travel. Feeling better after xanax 0.5mg  given here in ER. No suicidal or homicidal ideation. No psychosis. Pt encouraged to establsih and follow-up with PCP for management of meds. VS stable, pt feeling ok to go home.       Irish Elders, NP 11/30/13 [phone]

## 2013-12-03 NOTE — ED Provider Notes (Signed)
Medical screening examination/treatment/procedure(s) were performed by non-physician practitioner and as supervising physician I was immediately available for consultation/collaboration.  Devlon Dosher T Frederick Klinger, MD 12/03/13 1630 

## 2014-01-13 ENCOUNTER — Encounter (HOSPITAL_BASED_OUTPATIENT_CLINIC_OR_DEPARTMENT_OTHER): Payer: Self-pay | Admitting: Emergency Medicine

## 2014-01-13 ENCOUNTER — Emergency Department (HOSPITAL_BASED_OUTPATIENT_CLINIC_OR_DEPARTMENT_OTHER)
Admission: EM | Admit: 2014-01-13 | Discharge: 2014-01-13 | Disposition: A | Discharge status: Home / Self Care | Payer: Self-pay | Attending: Emergency Medicine | Admitting: Emergency Medicine

## 2014-01-13 DIAGNOSIS — J069 Acute upper respiratory infection, unspecified: Secondary | ICD-10-CM

## 2014-01-13 DIAGNOSIS — F17200 Nicotine dependence, unspecified, uncomplicated: Secondary | ICD-10-CM | POA: Insufficient documentation

## 2014-01-13 DIAGNOSIS — F411 Generalized anxiety disorder: Secondary | ICD-10-CM | POA: Insufficient documentation

## 2014-01-13 DIAGNOSIS — F3289 Other specified depressive episodes: Secondary | ICD-10-CM | POA: Insufficient documentation

## 2014-01-13 DIAGNOSIS — F329 Major depressive disorder, single episode, unspecified: Secondary | ICD-10-CM | POA: Insufficient documentation

## 2014-01-13 DIAGNOSIS — J029 Acute pharyngitis, unspecified: Secondary | ICD-10-CM

## 2014-01-13 DIAGNOSIS — I10 Essential (primary) hypertension: Secondary | ICD-10-CM | POA: Insufficient documentation

## 2014-01-13 DIAGNOSIS — F1021 Alcohol dependence, in remission: Secondary | ICD-10-CM | POA: Insufficient documentation

## 2014-01-13 DIAGNOSIS — Z79899 Other long term (current) drug therapy: Secondary | ICD-10-CM | POA: Insufficient documentation

## 2014-01-13 HISTORY — DX: Alcohol dependence, uncomplicated: F10.20

## 2014-01-13 NOTE — ED Notes (Signed)
[facility] a [facility]

## 2014-01-13 NOTE — Discharge Instructions (Signed)
Cough, Adult  A cough is a reflex that helps clear your throat and airways. It can help heal the body or may be a reaction to an irritated airway. A cough may only last 2 or 3 weeks (acute) or may last more than 8 weeks (chronic).  CAUSES Acute cough:  Viral or bacterial infections. Chronic cough:  Infections.  Allergies.  Asthma.  Post-nasal drip.  Smoking.  Heartburn or acid reflux.  Some medicines.  Chronic lung problems (COPD).  Cancer. SYMPTOMS   Cough.  Fever.  Chest pain.  Increased breathing rate.  High-pitched whistling sound when breathing (wheezing).  Colored mucus that you cough up (sputum). TREATMENT   A bacterial cough may be treated with antibiotic medicine.  A viral cough must run its course and will not respond to antibiotics.  Your caregiver may recommend other treatments if you have a chronic cough. HOME CARE INSTRUCTIONS   Only take over-the-counter or prescription medicines for pain, discomfort, or fever as directed by your caregiver. Use cough suppressants only as directed by your caregiver.  Use a cold steam vaporizer or humidifier in your bedroom or home to help loosen secretions.  Sleep in a semi-upright position if your cough is worse at night.  Rest as needed.  Stop smoking if you smoke. SEEK IMMEDIATE MEDICAL CARE IF:   You have pus in your sputum.  Your cough starts to worsen.  You cannot control your cough with suppressants and are losing sleep.  You begin coughing up blood.  You have difficulty breathing.  You develop pain which is getting worse or is uncontrolled with medicine.  You have a fever. MAKE SURE YOU:   Understand these instructions.  Will watch your condition.  Will get help right away if you are not doing well or get worse. Document Released: 05/30/2011 Document Revised: 02/23/2012 Document Reviewed: 05/30/2011 ExitCare Patient Information 2014 [employer].  Upper Respiratory Infection,  Adult An upper respiratory infection (URI) is also sometimes known as the common cold. The upper respiratory tract includes the nose, sinuses, throat, trachea, and bronchi. Bronchi are the airways leading to the lungs. Most people improve within 1 week, but symptoms can last up to 2 weeks. A residual cough may last even longer.  CAUSES Many different viruses can infect the tissues lining the upper respiratory tract. The tissues become irritated and inflamed and often become very moist. Mucus production is also common. A cold is contagious. You can easily spread the virus to others by oral contact. This includes kissing, sharing a glass, coughing, or sneezing. Touching your mouth or nose and then touching a surface, which is then touched by another person, can also spread the virus. SYMPTOMS  Symptoms typically develop 1 to 3 days after you come in contact with a cold virus. Symptoms vary from person to person. They may include:  Runny nose.  Sneezing.  Nasal congestion.  Sinus irritation.  Sore throat.  Loss of voice (laryngitis).  Cough.  Fatigue.  Muscle aches.  Loss of appetite.  Headache.  Low-grade fever. DIAGNOSIS  You might diagnose your own cold based on familiar symptoms, since most people get a cold 2 to 3 times a year. Your caregiver can confirm this based on your exam. Most importantly, your caregiver can check that your symptoms are not due to another disease such as strep throat, sinusitis, pneumonia, asthma, or epiglottitis. Blood tests, throat tests, and X-rays are not necessary to diagnose a common cold, but they may sometimes be helpful   in excluding other more serious diseases. Your caregiver will decide if any further tests are required. RISKS AND COMPLICATIONS  You may be at risk for a more severe case of the common cold if you smoke cigarettes, have chronic heart disease (such as heart failure) or lung disease (such as asthma), or if you have a weakened immune  system. The very young and very old are also at risk for more serious infections. Bacterial sinusitis, middle ear infections, and bacterial pneumonia can complicate the common cold. The common cold can worsen asthma and chronic obstructive pulmonary disease (COPD). Sometimes, these complications can require emergency medical care and may be life-threatening. PREVENTION  The best way to protect against getting a cold is to practice good hygiene. Avoid oral or hand contact with people with cold symptoms. Wash your hands often if contact occurs. There is no clear evidence that vitamin C, vitamin E, echinacea, or exercise reduces the chance of developing a cold. However, it is always recommended to get plenty of rest and practice good nutrition. TREATMENT  Treatment is directed at relieving symptoms. There is no cure. Antibiotics are not effective, because the infection is caused by a virus, not by bacteria. Treatment may include:  Increased fluid intake. Sports drinks offer valuable electrolytes, sugars, and fluids.  Breathing heated mist or steam (vaporizer or shower).  Eating chicken soup or other clear broths, and maintaining good nutrition.  Getting plenty of rest.  Using gargles or lozenges for comfort.  Controlling fevers with ibuprofen or acetaminophen as directed by your caregiver.  Increasing usage of your inhaler if you have asthma. Zinc gel and zinc lozenges, taken in the first 24 hours of the common cold, can shorten the duration and lessen the severity of symptoms. Pain medicines may help with fever, muscle aches, and throat pain. A variety of non-prescription medicines are available to treat congestion and runny nose. Your caregiver can make recommendations and may suggest nasal or lung inhalers for other symptoms.  HOME CARE INSTRUCTIONS   Only take over-the-counter or prescription medicines for pain, discomfort, or fever as directed by your caregiver.  Use a warm mist humidifier  or inhale steam from a shower to increase air moisture. This may keep secretions moist and make it easier to breathe.  Drink enough water and fluids to keep your urine clear or pale yellow.  Rest as needed.  Return to work when your temperature has returned to normal or as your caregiver advises. You may need to stay home longer to avoid infecting others. You can also use a face mask and careful hand washing to prevent spread of the virus. SEEK MEDICAL CARE IF:   After the first few days, you feel you are getting worse rather than better.  You need your caregiver's advice about medicines to control symptoms.  You develop chills, worsening shortness of breath, or brown or red sputum. These may be signs of pneumonia.  You develop yellow or brown nasal discharge or pain in the face, especially when you bend forward. These may be signs of sinusitis.  You develop a fever, swollen neck glands, pain with swallowing, or white areas in the back of your throat. These may be signs of strep throat. SEEK IMMEDIATE MEDICAL CARE IF:   You have a fever.  You develop severe or persistent headache, ear pain, sinus pain, or chest pain.  You develop wheezing, a prolonged cough, cough up blood, or have a change in your usual mucus (if you have chronic   lung disease).  You develop sore muscles or a stiff neck. Document Released: 05/27/2001 Document Revised: 02/23/2012 Document Reviewed: 04/04/2011 ExitCare Patient Information 2014 [employer].  

## 2014-01-13 NOTE — ED Provider Notes (Signed)
CSN: [id number]     Arrival date & time 01/13/14  1716 History   First MD Initiated Contact with Patient 01/13/14 1727     Chief Complaint  Patient presents with  . Sore Throat  . Cough    HPI  Patient is at [facility] recovery. He is in recovery for alcohol use. He had a cough for a few days for sore throat today. He's had no fever. Has not had bodyaches. Not vomiting. As I walk in to examine him he is eating a full chicken dinner with biscuit and salad and drinking Kool-Aid. Had some Tylenol a few hours ago.  Past Medical History  Diagnosis Date  . Anxiety   . Hypertension   . Depression   . Alcoholism    History reviewed. No pertinent past surgical history. History reviewed. No pertinent family history. History  Substance Use Topics  . Smoking status: Current Every Day Smoker  . Smokeless tobacco: Not on file  . Alcohol Use: Yes     Comment: in rehab    Review of Systems  Constitutional: Negative for fever, chills, diaphoresis, appetite change and fatigue.  HENT: Positive for sore throat. Negative for mouth sores and trouble swallowing.   Eyes: Negative for visual disturbance.  Respiratory: Positive for cough. Negative for chest tightness, shortness of breath and wheezing.   Cardiovascular: Negative for chest pain.  Gastrointestinal: Negative for nausea, vomiting, abdominal pain, diarrhea and abdominal distention.  Endocrine: Negative for polydipsia, polyphagia and polyuria.  Genitourinary: Negative for dysuria, frequency and hematuria.  Musculoskeletal: Negative for gait problem.  Skin: Negative for color change, pallor and rash.  Neurological: Negative for dizziness, syncope, light-headedness and headaches.  Hematological: Does not bruise/bleed easily.  Psychiatric/Behavioral: Negative for behavioral problems and confusion.    Allergies  Review of patient's allergies indicates no known allergies.  Home Medications   Current Outpatient Rx  Name  Route  Sig   Dispense  Refill  . ALPRAZolam (XANAX) 0.5 MG tablet   Oral   Take 1 tablet (0.5 mg total) by mouth at bedtime as needed for anxiety.   20 tablet   0   . ARIPiprazole (ABILIFY) 20 MG tablet   Oral   Take 20 mg by mouth daily.          . Aspirin-Caffeine (BC FAST PAIN RELIEF ARTHRITIS) 1000-65 MG PACK   Oral   Take 1 Package by mouth 2 (two) times daily as needed (pain).         Marland Kitchen escitalopram (LEXAPRO) 10 MG tablet   Oral   Take 10 mg by mouth daily.         . hydrochlorothiazide (HYDRODIURIL) 25 MG tablet   Oral   Take 1 tablet (25 mg total) by mouth daily.   30 tablet   1   . ibuprofen (ADVIL,MOTRIN) 100 MG tablet   Oral   Take 200 mg by mouth every 6 (six) hours as needed for fever (pain).         Marland Kitchen loratadine (CLARITIN) 10 MG tablet   Oral   Take 10 mg by mouth daily.          BP 125/89  Pulse 86  Temp(Src) 97.5 F (36.4 C) (Oral)  Resp 20  Ht 6\' 5"  (1.956 m)  Wt 228 lb (103.42 kg)  BMI 27.03 kg/m2  SpO2 94% Physical Exam  HENT:  TMs appear normal. Normal nares. Conjunctiva not injected. Posterior drinks is benign. No tonsillar hypertrophy. Normal uvula.  No exudate. No anterior cervical adenopathy.  Pulmonary/Chest:  His lungs are clear. No wheezing rales rhonchi or prolongation. Recheck temperature is 98.4. 96% saturations. No tachypnea    ED Course  Procedures (including critical care time) Labs Review Labs Reviewed - No data to display Imaging Review No results found.  EKG Interpretation   None       MDM   1. Pharyngitis   2. Upper respiratory infection    No objective findings of the support diagnosis of bacterial pharyngitis. No fever abnormal breath sounds hypoxemia to suggest pneumonia. No myalgias or high fevers just influenza. It is a simple viral syndrome. Plan is expected management.    Rolland PorterMark Jeromie Gainor, MD 01/13/14 [phone]

## 2014-10-16 ENCOUNTER — Ambulatory Visit: Payer: No Typology Code available for payment source | Attending: Internal Medicine | Admitting: Internal Medicine

## 2014-10-16 ENCOUNTER — Ambulatory Visit (HOSPITAL_BASED_OUTPATIENT_CLINIC_OR_DEPARTMENT_OTHER): Payer: No Typology Code available for payment source | Admitting: *Deleted

## 2014-10-16 ENCOUNTER — Encounter: Payer: Self-pay | Admitting: Internal Medicine

## 2014-10-16 VITALS — BP 115/74 | HR 71 | Temp 98.0°F | Resp 16 | Ht 78.0 in | Wt 266.8 lb

## 2014-10-16 DIAGNOSIS — R21 Rash and other nonspecific skin eruption: Secondary | ICD-10-CM | POA: Insufficient documentation

## 2014-10-16 DIAGNOSIS — I10 Essential (primary) hypertension: Secondary | ICD-10-CM | POA: Insufficient documentation

## 2014-10-16 DIAGNOSIS — Z590 Homelessness: Secondary | ICD-10-CM | POA: Insufficient documentation

## 2014-10-16 DIAGNOSIS — R61 Generalized hyperhidrosis: Secondary | ICD-10-CM | POA: Insufficient documentation

## 2014-10-16 DIAGNOSIS — Z23 Encounter for immunization: Secondary | ICD-10-CM

## 2014-10-16 DIAGNOSIS — F1290 Cannabis use, unspecified, uncomplicated: Secondary | ICD-10-CM | POA: Insufficient documentation

## 2014-10-16 DIAGNOSIS — F1490 Cocaine use, unspecified, uncomplicated: Secondary | ICD-10-CM | POA: Insufficient documentation

## 2014-10-16 DIAGNOSIS — L989 Disorder of the skin and subcutaneous tissue, unspecified: Secondary | ICD-10-CM

## 2014-10-16 DIAGNOSIS — F419 Anxiety disorder, unspecified: Secondary | ICD-10-CM | POA: Insufficient documentation

## 2014-10-16 DIAGNOSIS — F17200 Nicotine dependence, unspecified, uncomplicated: Secondary | ICD-10-CM | POA: Insufficient documentation

## 2014-10-16 LAB — COMPLETE METABOLIC PANEL WITH GFR
ALT: 14 U/L (ref 0–53)
AST: 20 U/L (ref 0–37)
Albumin: 4.1 g/dL (ref 3.5–5.2)
Alkaline Phosphatase: 75 U/L (ref 39–117)
BUN: 19 mg/dL (ref 6–23)
CALCIUM: 9.5 mg/dL (ref 8.4–10.5)
CO2: 29 mEq/L (ref 19–32)
CREATININE: 1.1 mg/dL (ref 0.50–1.35)
Chloride: 98 mEq/L (ref 96–112)
GFR, Est African American: 83 mL/min
GFR, Est Non African American: 72 mL/min
Glucose, Bld: 103 mg/dL — ABNORMAL HIGH (ref 70–99)
Potassium: 4.1 mEq/L (ref 3.5–5.3)
SODIUM: 137 meq/L (ref 135–145)
Total Bilirubin: 0.2 mg/dL (ref 0.2–1.2)
Total Protein: 8 g/dL (ref 6.0–8.3)

## 2014-10-16 LAB — LIPID PANEL
Cholesterol: 187 mg/dL (ref 0–200)
HDL: 43 mg/dL (ref >39)
LDL CALC: 111 mg/dL — AB (ref 0–99)
TRIGLYCERIDES: 164 mg/dL — AB (ref <150)
Total CHOL/HDL Ratio: 4.3 Ratio
VLDL: 33 mg/dL (ref 0–40)

## 2014-10-16 LAB — CBC WITH DIFFERENTIAL/PLATELET
BASOS ABS: 0 10*3/uL (ref 0.0–0.1)
Basophils Relative: 0 % (ref 0–1)
Eosinophils Absolute: 0 10*3/uL (ref 0.0–0.7)
Eosinophils Relative: 0 % (ref 0–5)
HCT: 41.4 % (ref 39.0–52.0)
Hemoglobin: 13.7 g/dL (ref 13.0–17.0)
Lymphocytes Relative: 26 % (ref 12–46)
Lymphs Abs: 1.9 10*3/uL (ref 0.7–4.0)
MCH: 26 pg (ref 26.0–34.0)
MCHC: 33.1 g/dL (ref 30.0–36.0)
MCV: 78.6 fL (ref 78.0–100.0)
Monocytes Absolute: 0.6 10*3/uL (ref 0.1–1.0)
Monocytes Relative: 8 % (ref 3–12)
NEUTROS ABS: 4.8 10*3/uL (ref 1.7–7.7)
NEUTROS PCT: 66 % (ref 43–77)
Platelets: 319 10*3/uL (ref 150–400)
RBC: 5.27 MIL/uL (ref 4.22–5.81)
RDW: 14.3 % (ref 11.5–15.5)
WBC: 7.3 10*3/uL (ref 4.0–10.5)

## 2014-10-16 LAB — TSH: TSH: 0.92 u[IU]/mL (ref 0.350–4.500)

## 2014-10-16 MED ORDER — BACITRACIN 500 UNIT/GM EX OINT: 1.0000 "application " | TOPICAL_OINTMENT | Freq: Two times a day (BID) | CUTANEOUS | Status: DC

## 2014-10-16 MED ORDER — CEPHALEXIN 500 MG PO CAPS
500.0000 mg | ORAL_CAPSULE | Freq: Three times a day (TID) | ORAL | Status: DC
Start: 2014-10-16 — End: 2016-09-15

## 2014-10-16 MED ORDER — HYDROCHLOROTHIAZIDE 25 MG PO TABS: 25.0000 mg | ORAL_TABLET | Freq: Every day | ORAL | Status: DC

## 2014-10-16 NOTE — Patient Instructions (Addendum)
DASH Eating Plan °DASH stands for "Dietary Approaches to Stop Hypertension." The DASH eating plan is a healthy eating plan that has been shown to reduce high blood pressure (hypertension). Additional health benefits may include reducing the risk of type 2 diabetes mellitus, heart disease, and stroke. The DASH eating plan may also help with weight loss. °WHAT DO I NEED TO KNOW ABOUT THE DASH EATING PLAN? °For the DASH eating plan, you will follow these general guidelines: °· Choose foods with a percent daily value for sodium of less than 5% (as listed on the food label). °· Use salt-free seasonings or herbs instead of table salt or sea salt. °· Check with your health care provider or pharmacist before using salt substitutes. °· Eat lower-sodium products, often labeled as "lower sodium" or "no salt added." °· Eat fresh foods. °· Eat more vegetables, fruits, and low-fat dairy products. °· Choose whole grains. Look for the word "whole" as the first word in the ingredient list. °· Choose fish and skinless chicken or turkey more often than red meat. Limit fish, poultry, and meat to 6 oz (170 g) each day. °· Limit sweets, desserts, sugars, and sugary drinks. °· Choose heart-healthy fats. °· Limit cheese to 1 oz (28 g) per day. °· Eat more home-cooked food and less restaurant, buffet, and fast food. °· Limit fried foods. °· Cook foods using methods other than frying. °· Limit canned vegetables. If you do use them, rinse them well to decrease the sodium. °· When eating at a restaurant, ask that your food be prepared with less salt, or no salt if possible. °WHAT FOODS CAN I EAT? °Seek help from a dietitian for individual calorie needs. °Grains °Whole grain or whole wheat bread. Brown rice. Whole grain or whole wheat pasta. Quinoa, bulgur, and whole grain cereals. Low-sodium cereals. Corn or whole wheat flour tortillas. Whole grain cornbread. Whole grain crackers. Low-sodium crackers. °Vegetables °Fresh or frozen vegetables  (raw, steamed, roasted, or grilled). Low-sodium or reduced-sodium tomato and vegetable juices. Low-sodium or reduced-sodium tomato sauce and paste. Low-sodium or reduced-sodium canned vegetables.  °Fruits °All fresh, canned (in natural juice), or frozen fruits. °Meat and Other Protein Products °Ground beef (85% or leaner), grass-fed beef, or beef trimmed of fat. Skinless chicken or turkey. Ground chicken or turkey. Pork trimmed of fat. All fish and seafood. Eggs. Dried beans, peas, or lentils. Unsalted nuts and seeds. Unsalted canned beans. °Dairy °Low-fat dairy products, such as skim or 1% milk, 2% or reduced-fat cheeses, low-fat ricotta or cottage cheese, or plain low-fat yogurt. Low-sodium or reduced-sodium cheeses. °Fats and Oils °Tub margarines without trans fats. Light or reduced-fat mayonnaise and salad dressings (reduced sodium). Avocado. Safflower, olive, or canola oils. Natural peanut or almond butter. °Other °Unsalted popcorn and pretzels. °The items listed above may not be a complete list of recommended foods or beverages. Contact your dietitian for more options. °WHAT FOODS ARE NOT RECOMMENDED? °Grains °White bread. White pasta. White rice. Refined cornbread. Bagels and croissants. Crackers that contain trans fat. °Vegetables °Creamed or fried vegetables. Vegetables in a cheese sauce. Regular canned vegetables. Regular canned tomato sauce and paste. Regular tomato and vegetable juices. °Fruits °Dried fruits. Canned fruit in light or heavy syrup. Fruit juice. °Meat and Other Protein Products °Fatty cuts of meat. Ribs, chicken wings, bacon, sausage, bologna, salami, chitterlings, fatback, hot dogs, bratwurst, and packaged luncheon meats. Salted nuts and seeds. Canned beans with salt. °Dairy °Whole or 2% milk, cream, half-and-half, and cream cheese. Whole-fat or sweetened yogurt. Full-fat   cheeses or blue cheese. Nondairy creamers and whipped toppings. Processed cheese, cheese spreads, or cheese  curds. °Condiments °Onion and garlic salt, seasoned salt, table salt, and sea salt. Canned and packaged gravies. Worcestershire sauce. Tartar sauce. Barbecue sauce. Teriyaki sauce. Soy sauce, including reduced sodium. Steak sauce. Fish sauce. Oyster sauce. Cocktail sauce. Horseradish. Ketchup and mustard. Meat flavorings and tenderizers. Bouillon cubes. Hot sauce. Tabasco sauce. Marinades. Taco seasonings. Relishes. °Fats and Oils °Butter, stick margarine, lard, shortening, ghee, and bacon fat. Coconut, palm kernel, or palm oils. Regular salad dressings. °Other °Pickles and olives. Salted popcorn and pretzels. °The items listed above may not be a complete list of foods and beverages to avoid. Contact your dietitian for more information. °WHERE CAN I FIND MORE INFORMATION? °National Heart, Lung, and Blood Institute: www.nhlbi.nih.gov/health/health-topics/topics/dash/ °Document Released: 11/20/2011 Document Revised: 04/17/2014 Document Reviewed: 10/05/2013 °ExitCare® Patient Information ©2015 [employer]. This information is not intended to replace advice given to you by your health care provider. Make sure you discuss any questions you have with your health care provider. ° °

## 2014-10-16 NOTE — Progress Notes (Signed)
Patient ID: Ryan Herring, male   DOB: 1951/04/23, 63 y.o.   MRN: [id number]  [id number]:[id number]  [id number]  DOB - 1951/04/23  CC:  Chief Complaint  Patient presents with  . Establish Care  . Rash       HPI: Ryan Herring is a 63 y.o. male here today to establish medical care.  Patient presents to clinic today for a rash on his bilateral lower extremities for over one year.  He states that he has been homeless for 11 years and he is unsure if the rash is related to insect bites.  The rash is currently itchy and painful.  The rash is described as a tingling and sharp intermittent pain.  He denies fevers, chills, nausea, and vomiting.  The rash is not present on other parts of his body.  He has tried a OTC ointment without relief.    No Known Allergies Past Medical History  Diagnosis Date  . Anxiety   . Hypertension   . Depression   . Alcoholism    Current Outpatient Prescriptions on File Prior to Visit  Medication Sig Dispense Refill  . ALPRAZolam (XANAX) 0.5 MG tablet Take 1 tablet (0.5 mg total) by mouth at bedtime as needed for anxiety. 20 tablet 0  . ARIPiprazole (ABILIFY) 20 MG tablet Take 20 mg by mouth daily.     . Aspirin-Caffeine (BC FAST PAIN RELIEF ARTHRITIS) 1000-65 MG PACK Take 1 Package by mouth 2 (two) times daily as needed (pain).    Marland Kitchen. escitalopram (LEXAPRO) 10 MG tablet Take 10 mg by mouth daily.    . hydrochlorothiazide (HYDRODIURIL) 25 MG tablet Take 1 tablet (25 mg total) by mouth daily. 30 tablet 1  . ibuprofen (ADVIL,MOTRIN) 100 MG tablet Take 200 mg by mouth every 6 (six) hours as needed for fever (pain).    Marland Kitchen. loratadine (CLARITIN) 10 MG tablet Take 10 mg by mouth daily.     No current facility-administered medications on file prior to visit.   No family history on file. History   Social History  . Marital Status: Single    Spouse Name: N/A    Number of Children: N/A  . Years of Education: N/A   Occupational History  . Not on file.   Social History  Main Topics  . Smoking status: Current Every Day Smoker  . Smokeless tobacco: Not on file  . Alcohol Use: Yes     Comment: in rehab  . Drug Use: Yes    Special: Cocaine, Marijuana  . Sexual Activity: Not on file   Other Topics Concern  . Not on file   Social History Narrative    Review of Systems  Constitutional: Negative.   HENT: Negative.   Respiratory: Negative.   Cardiovascular: Positive for leg swelling. Negative for chest pain and palpitations.  Genitourinary: Negative.   Musculoskeletal: Negative.   Skin: Positive for itching and rash.  Neurological: Positive for tingling. Negative for dizziness.  Psychiatric/Behavioral: Positive for depression and substance abuse.  All other systems reviewed and are negative.     Objective:   Filed Vitals:   10/16/14 0920  BP: 115/74  Pulse: 71  Temp: 98 F (36.7 C)  Resp: 16    Physical Exam  Constitutional: He is oriented to person, place, and time. He appears well-developed.  HENT:  Right Ear: External ear normal.  Left Ear: External ear normal.  Eyes: Conjunctivae are normal. Pupils are equal, round, and reactive to light.  Neck: Normal range of  motion. Neck supple. No thyromegaly present.  Cardiovascular: Normal rate, regular rhythm, normal heart sounds and intact distal pulses.   Pulmonary/Chest: Effort normal and breath sounds normal.  Abdominal: Soft. Bowel sounds are normal.  Musculoskeletal: Normal range of motion.  Lymphadenopathy:    He has no cervical adenopathy.  Neurological: He is alert and oriented to person, place, and time.  Skin: Skin is warm. Abrasion, bruising and lesion noted. He is diaphoretic.     Psychiatric: He has a normal mood and affect.  Vitals reviewed.    Lab Results  Component Value Date   WBC 7.9 11/10/2013   HGB 12.0* 11/10/2013   HCT 36.2* 11/10/2013   MCV 82.3 11/10/2013   PLT 260 11/10/2013   Lab Results  Component Value Date   CREATININE 0.81 11/10/2013   BUN 15  11/10/2013   NA 137 11/10/2013   K 3.7 11/10/2013   CL 102 11/10/2013   CO2 28 11/10/2013    No results found for: HGBA1C Lipid Panel  No results found for: CHOL, TRIG, HDL, CHOLHDL, VLDL, LDLCALC `    Assessment and plan:   Ryan Herring was seen today for establish care and rash.  Diagnoses and associated orders for this visit:  Essential hypertension - hydrochlorothiazide (HYDRODIURIL) 25 MG tablet; Take 1 tablet (25 mg total) by mouth daily. - CBC with Differential - COMPLETE METABOLIC PANEL WITH GFR - TSH - Hemoglobin A1c - Lipid panel - PSA  Skin lesion - bacitracin 500 UNIT/GM ointment; Apply 1 application topically 2 (two) times daily. - cephALEXin (KEFLEX) 500 MG capsule; Take 1 capsule (500 mg total) by mouth 3 (three) times daily.  Need for influenza vaccination Influenza vaccine received   Return in about 3 months (around 01/16/2015) for Hypertension.  The patient was given clear instructions to go to ER or return to medical center if symptoms don't improve, worsen or new problems develop. The patient verbalized understanding.   Holland CommonsKECK, VALERIE, NP-C [facility] [phone] 10/16/2014, 9:22 AM

## 2014-10-16 NOTE — Progress Notes (Signed)
Establish Care Complaining of rash in legs. Itching and irritated, stated possible insect bite since he is been homeless for 11 years.

## 2014-10-17 LAB — PSA: PSA: 0.22 ng/mL (ref <=4.00)

## 2014-10-17 LAB — HEMOGLOBIN A1C
Hgb A1c MFr Bld: 6.4 % — ABNORMAL HIGH (ref <5.7)
Mean Plasma Glucose: 137 mg/dL — ABNORMAL HIGH (ref <117)

## 2014-11-07 ENCOUNTER — Telehealth: Payer: Self-pay | Admitting: *Deleted

## 2014-11-07 MED ORDER — METFORMIN HCL ER 500 MG PO TB24: 500.0000 mg | ORAL_TABLET | Freq: Every day | ORAL | Status: DC

## 2014-11-07 NOTE — Telephone Encounter (Signed)
-----   Message from Ambrose FinlandValerie A Keck, NP sent at 10/17/2014  9:43 PM EST ----- Patient is right on the borderline of being diagnosed as diabetic. Let patient know that it may be beneficial ahead and start with metformin XR 500 mg once daily. These explained that this may help what weight loss and to slow the onset of diabetes mellitus. These explained to patient that metformin may cause slight stomach upset and diarrhea within the first week of use but his symptoms should improve. Please also educate patient about cholesterol and specific diet and lifestyle modifications he should make. If patient has any further questions he is welcome to make a nurse visit with Leotis ShamesLauren

## 2014-11-07 NOTE — Telephone Encounter (Addendum)
Patient notified of lab results and instructions [facility]

## 2015-11-16 ENCOUNTER — Emergency Department (INDEPENDENT_AMBULATORY_CARE_PROVIDER_SITE_OTHER)
Admission: EM | Admit: 2015-11-16 | Discharge: 2015-11-16 | Disposition: A | Discharge status: Home / Self Care | Payer: No Typology Code available for payment source | Source: Home / Self Care | Attending: Family Medicine | Admitting: Family Medicine

## 2015-11-16 ENCOUNTER — Encounter (HOSPITAL_COMMUNITY): Payer: Self-pay | Admitting: *Deleted

## 2015-11-16 DIAGNOSIS — S39012A Strain of muscle, fascia and tendon of lower back, initial encounter: Secondary | ICD-10-CM

## 2015-11-16 LAB — POCT I-STAT, CHEM 8
BUN: 11 mg/dL (ref 6–20)
CALCIUM ION: 1.21 mmol/L (ref 1.13–1.30)
CHLORIDE: 103 mmol/L (ref 101–111)
Creatinine, Ser: 1 mg/dL (ref 0.61–1.24)
Glucose, Bld: 122 mg/dL — ABNORMAL HIGH (ref 65–99)
HCT: 48 % (ref 39.0–52.0)
Hemoglobin: 16.3 g/dL (ref 13.0–17.0)
POTASSIUM: 3.7 mmol/L (ref 3.5–5.1)
SODIUM: 144 mmol/L (ref 135–145)
TCO2: 29 mmol/L (ref 0–100)

## 2015-11-16 LAB — POCT URINALYSIS DIP (DEVICE)
BILIRUBIN URINE: NEGATIVE
Glucose, UA: NEGATIVE mg/dL
Hgb urine dipstick: NEGATIVE
Ketones, ur: NEGATIVE mg/dL
LEUKOCYTES UA: NEGATIVE
NITRITE: NEGATIVE
PH: 6 (ref 5.0–8.0)
Protein, ur: NEGATIVE mg/dL
Specific Gravity, Urine: >=1.03 (ref 1.005–1.030)
Urobilinogen, UA: 0.2 mg/dL (ref 0.0–1.0)

## 2015-11-16 MED ORDER — IBUPROFEN 800 MG PO TABS: 800.0000 mg | ORAL_TABLET | Freq: Three times a day (TID) | ORAL | Status: DC

## 2015-11-16 NOTE — ED Notes (Signed)
l   Side  Back pain  -          Started today        Pain  Worse   When  He moves        And  Ambulates             denys  Any   Urinary  Symptoms         Ambulates  With a  Slow  Yet  Steady  Gait  denys  Any   Abnormal  Bowel     Movements

## 2015-11-16 NOTE — ED Provider Notes (Signed)
CSN: [id number]     Arrival date & time 11/16/15  1415 History   First MD Initiated Contact with Patient 11/16/15 1607     Chief Complaint  Patient presents with  . Back Pain   (Consider location/radiation/quality/duration/timing/severity/associated sxs/prior Treatment) Patient is a 64 y.o. male presenting with abdominal pain. The history is provided by the patient.  Abdominal Pain Pain location:  L flank Pain quality: cramping and sharp   Pain radiates to:  LLQ Pain severity:  Mild Duration:  1 day Chronicity:  New Relieved by:  None tried Worsened by:  Nothing tried Ineffective treatments:  None tried Associated symptoms: no anorexia, no constipation, no diarrhea, no dysuria, no fever, no nausea and no vomiting   Risk factors: obesity     Past Medical History  Diagnosis Date  . Anxiety   . Hypertension   . Depression   . Alcoholism (HCC)    History reviewed. No pertinent past surgical history. History reviewed. No pertinent family history. Social History  Substance Use Topics  . Smoking status: Never Smoker   . Smokeless tobacco: Never Used  . Alcohol Use: Yes     Comment: in rehab    Review of Systems  Constitutional: Negative for fever.  Cardiovascular: Negative.   Gastrointestinal: Positive for abdominal pain. Negative for nausea, vomiting, diarrhea, constipation and anorexia.  Genitourinary: Negative for dysuria.  Musculoskeletal: Negative.   Skin: Negative.   All other systems reviewed and are negative.   Allergies  Review of patient's allergies indicates no known allergies.  Home Medications   Prior to Admission medications   Medication Sig Start Date End Date Taking? Authorizing Provider  bacitracin 500 UNIT/GM ointment Apply 1 application topically 2 (two) times daily. 10/16/14   Ambrose Finland, NP  cephALEXin (KEFLEX) 500 MG capsule Take 1 capsule (500 mg total) by mouth 3 (three) times daily. 10/16/14   Ambrose Finland, NP  hydrochlorothiazide  (HYDRODIURIL) 25 MG tablet Take 1 tablet (25 mg total) by mouth daily. 10/16/14   Ambrose Finland, NP  ibuprofen (ADVIL,MOTRIN) 800 MG tablet Take 1 tablet (800 mg total) by mouth 3 (three) times daily. 11/16/15   Linna Hoff, MD  metFORMIN (GLUCOPHAGE XR) 500 MG 24 hr tablet Take 1 tablet (500 mg total) by mouth daily with breakfast. 11/07/14   Ambrose Finland, NP   Meds Ordered and Administered this Visit  Medications - No data to display  BP 135/94 mmHg  Pulse 78  Temp(Src) 98 F (36.7 C) (Oral)  Resp 16  SpO2 96% No data found.   Physical Exam  Constitutional: He is oriented to person, place, and time. He appears well-developed and well-nourished. He appears distressed.  Neck: Normal range of motion. Neck supple.  Pulmonary/Chest: Effort normal and breath sounds normal.  Abdominal: Soft. Bowel sounds are normal. There is CVA tenderness. There is no guarding. No hernia.  Lymphadenopathy:    He has no cervical adenopathy.  Neurological: He is alert and oriented to person, place, and time.  Skin: Skin is warm and dry.  Nursing note and vitals reviewed.   ED Course  Procedures (including critical care time)  Labs Review Labs Reviewed  POCT URINALYSIS DIP (DEVICE)   U/a neg   Imaging Review No results found.   Visual Acuity Review  Right Eye Distance:   Left Eye Distance:   Bilateral Distance:    Right Eye Near:   Left Eye Near:    Bilateral Near:  MDM   1. Low back strain, initial encounter        Linna HoffJames D Kindl, MD 11/16/15 [phone]

## 2015-11-16 NOTE — Discharge Instructions (Signed)
Heat to back and medicine as needed, return or see your doctor if further problems

## 2015-12-21 DIAGNOSIS — T310 Burns involving less than 10% of body surface: Secondary | ICD-10-CM

## 2015-12-21 DIAGNOSIS — T23241A Burn of second degree of multiple right fingers (nail), including thumb, initial encounter: Secondary | ICD-10-CM

## 2015-12-21 DIAGNOSIS — T23222A Burn of second degree of single left finger (nail) except thumb, initial encounter: Secondary | ICD-10-CM

## 2015-12-21 NOTE — Congregational Nurse Program (Signed)
12/21/15 - 0900 - CN nurse [facility]  [facility]  Client presented with right thumb second degree burn and right index finger second degree burn.  Burn on left was second degree located on index finger.  Client verbalized his pain was 6 on scale from 0-10.  Client stated, "I've been wearing my gloves for a week over it but I did not think it would help, so I came here."  Client stated, "I burned my hands while I was cooking."  Client escorted to exam room and client washed is hands and the wounds with soap and water.  Wounds on right hand draining serous fluid. No purulence noted.  Edema noted of right thumb and finger. Edema noted on left finger as well as serous fluid, no purulence noted.  Wounds to right thumb dressed with Bacitracin ointment and clean gauze dressing. Right finger wound left open to air as it had minimal serous drainage.  Left finger dressed with Bacitracin and clean gauze dressing applied and secured. Client instructed not to wear latex gloves over dressings but he could use his cloth outer gloves.  Client reciprocated understanding.  Client requested bandaids if needed and they were given to him.  Client also received Acetaminophen 500 mg PO to take for pain. Client had been prescribed an antihypertensive and anti-diabetic medication which he said he was out of because he could not afford it.  Encouraged client to make an appointment with Lavinia SharpsMary Ann Placey, FNP. [facility]   Alphonse GuildBeth Kian Gamarra, MSN,RN

## 2016-01-02 NOTE — Congregational Nurse Program (Signed)
Client in to see RN to assess and help dress a recent burn on left index finger and right thumb.  Wounds are second degree burn drainage is small amount of serous drainage.  Client washed hands with soap and water an dried.  Then bacitracin ointment applied to wounds and wrapped with telfa and secured with gauze and tape.  Client states pain is a 6 on a scale from 0-10.  Client given Advil 2 po for pain.  Client instructed to make an appointment with Chales Abrahams Placey,FNP.  Client agreed. Alphonse Guild, RN

## 2016-09-15 ENCOUNTER — Encounter (HOSPITAL_BASED_OUTPATIENT_CLINIC_OR_DEPARTMENT_OTHER): Payer: Self-pay

## 2016-09-15 ENCOUNTER — Emergency Department (HOSPITAL_BASED_OUTPATIENT_CLINIC_OR_DEPARTMENT_OTHER)
Admission: EM | Admit: 2016-09-15 | Discharge: 2016-09-15 | Disposition: A | Discharge status: Home / Self Care | Payer: Self-pay | Attending: Emergency Medicine | Admitting: Emergency Medicine

## 2016-09-15 DIAGNOSIS — I10 Essential (primary) hypertension: Secondary | ICD-10-CM | POA: Insufficient documentation

## 2016-09-15 DIAGNOSIS — B86 Scabies: Secondary | ICD-10-CM | POA: Insufficient documentation

## 2016-09-15 DIAGNOSIS — R234 Changes in skin texture: Secondary | ICD-10-CM

## 2016-09-15 HISTORY — DX: Cocaine abuse, uncomplicated: F14.10

## 2016-09-15 HISTORY — DX: Alcohol abuse, uncomplicated: F10.10

## 2016-09-15 NOTE — ED Notes (Signed)
[facility] called for transport 

## 2016-09-15 NOTE — ED Provider Notes (Signed)
[facility] Provider [facility]   CSN: [id number] Arrival date & time: 09/15/16  2027   By signing my name below, I, Teofilo PodMatthew P. Jamison, attest that this documentation has been prepared under the direction and in the presence of Coral Timme, MD Electronically Signed: Teofilo PodMatthew P. Jamison, ED Scribe. 09/15/2016. 11:21 PM.   History   Chief Complaint Chief Complaint  Patient presents with  . Leg Problem    The history is provided by the patient. No language interpreter was used.  Wound Check  This is a new problem. The current episode started more than 1 week ago. The problem occurs constantly. The problem has not changed since onset.Pertinent negatives include no chest pain, no abdominal pain, no headaches and no shortness of breath. Nothing aggravates the symptoms. Nothing relieves the symptoms. He has tried nothing for the symptoms. The treatment provided no relief.  Patient used to be homeless and got bitten by lots of insects.  Has scabs on B shins.  [facility] and [facility] need EM to evaluate the scabs.  No f/c/r. No drainage.  No changes.   HPI Comments:  Ryan Herring is a 65 y.o. male who presents to the Emergency Department complaining of itchy wounds to his legs x years. Pt states that he has been itching the wounds.     Past Medical History:  Diagnosis Date  . Alcoholism (HCC)   . Anxiety   . Cocaine abuse   . Depression   . ETOH abuse   . Hypertension     Patient Active Problem List   Diagnosis Date Noted  . HTN (hypertension) 10/16/2014    History reviewed. No pertinent surgical history.     Home Medications    Prior to Admission medications   Not on File    Family History No family history on file.  Social History Social History  Substance Use Topics  . Smoking status: Never Smoker  . Smokeless tobacco: Never Used  . Alcohol use Not on file     Comment: in rehab     Allergies   Review of patient's allergies indicates no known  allergies.   Review of Systems Review of Systems  Constitutional: Negative for fever.  Respiratory: Negative for shortness of breath.   Cardiovascular: Negative for chest pain.  Gastrointestinal: Negative for abdominal pain.  Musculoskeletal: Negative for arthralgias and myalgias.  Skin: Positive for wound. Negative for color change, pallor and rash.  Neurological: Negative for headaches.  All other systems reviewed and are negative.    Physical Exam Updated Vital Signs BP 112/88 (BP Location: Left Arm)   Pulse 77   Temp 98 F (36.7 C) (Oral)   Resp 18   Ht 6\' 5"  (1.956 m)   Wt 279 lb (126.6 kg)   SpO2 98%   BMI 33.08 kg/m   Physical Exam  Constitutional: He is oriented to person, place, and time. He appears well-developed and well-nourished. No distress.  HENT:  Head: Normocephalic and atraumatic.  Mouth/Throat: Oropharynx is clear and moist. No oropharyngeal exudate.  Eyes: Conjunctivae and EOM are normal. Pupils are equal, round, and reactive to light.  Neck: Normal range of motion. Neck supple. No JVD present.  Cardiovascular: Normal rate, regular rhythm, normal heart sounds and intact distal pulses.   Pulmonary/Chest: Effort normal and breath sounds normal. No stridor. No respiratory distress. He has no wheezes. He has no rales.  Abdominal: Soft. Bowel sounds are normal. He exhibits no distension. There is no  tenderness.  Musculoskeletal: Normal range of motion.  Neurological: He is alert and oriented to person, place, and time. He has normal reflexes.  Skin: Skin is warm and dry. Capillary refill takes less than 2 seconds. He is not diaphoretic.  No fluctuance, no warmth no erythema; Scabbing and scars on both shins with lichenification  Psychiatric: He has a normal mood and affect.  Nursing note and vitals reviewed.    ED Treatments / Results   Vitals:   09/15/16 2035  BP: 112/88  Pulse: 77  Resp: 18  Temp: 98 F (36.7 C)     DIAGNOSTIC  STUDIES:  Oxygen Saturation is 98% on RA, normal by my interpretation.    COORDINATION OF CARE:  11:21 PM Discussed treatment plan with pt at bedside and pt agreed to plan.   Procedures Procedures (including critical care time)  Medications Ordered in ED Medications - No data to display   Initial Impression / Assessment and Plan / ED Course  I have reviewed the triage vital signs and the nursing notes.  Pertinent labs & imaging results that were available during my care of the patient were reviewed by me and considered in my medical decision making (see chart for details).  Clinical Course   Well appearing, these scabs are old based on lichenification.  No signs of infection.  Safe for discharge.  Do not scratch.  All questions answered to patient's satisfaction. Based on history and exam patient has been appropriately medically screened and emergency conditions excluded. Patient is stable for discharge at this time. Follow up with your PMD for recheck in 2 days and strict return precautions given  Final Clinical Impressions(s) / ED Diagnoses  I personally performed the services described in this documentation, which was scribed in my presence. The recorded information has been reviewed and is accurate.      Cy Blamer, MD 09/16/16 [phone]

## 2016-09-15 NOTE — ED Notes (Signed)
Patient has multiple areas of open skin to his bilateral lower extremities. Patient reports that they itch all the time  - he states that the wounds are from his scratching

## 2016-09-15 NOTE — ED Triage Notes (Signed)
Pt c/o "spots to my legs" x years-[facility] made him come to ED-at [facility] since 9/21 for EOTH and cocaine-NAD-steady gait

## 2016-09-16 ENCOUNTER — Encounter (HOSPITAL_BASED_OUTPATIENT_CLINIC_OR_DEPARTMENT_OTHER): Payer: Self-pay | Admitting: Emergency Medicine

## 2016-12-30 ENCOUNTER — Encounter (HOSPITAL_COMMUNITY): Payer: Self-pay

## 2016-12-30 ENCOUNTER — Emergency Department (HOSPITAL_COMMUNITY)
Admission: EM | Admit: 2016-12-30 | Discharge: 2016-12-30 | Disposition: A | Discharge status: Home / Self Care | Payer: Self-pay | Attending: Emergency Medicine | Admitting: Emergency Medicine

## 2016-12-30 ENCOUNTER — Emergency Department (HOSPITAL_COMMUNITY): Payer: Self-pay

## 2016-12-30 DIAGNOSIS — R05 Cough: Secondary | ICD-10-CM | POA: Insufficient documentation

## 2016-12-30 DIAGNOSIS — R0981 Nasal congestion: Secondary | ICD-10-CM | POA: Insufficient documentation

## 2016-12-30 DIAGNOSIS — I10 Essential (primary) hypertension: Secondary | ICD-10-CM | POA: Insufficient documentation

## 2016-12-30 DIAGNOSIS — Z79899 Other long term (current) drug therapy: Secondary | ICD-10-CM | POA: Insufficient documentation

## 2016-12-30 DIAGNOSIS — R69 Illness, unspecified: Secondary | ICD-10-CM

## 2016-12-30 DIAGNOSIS — J111 Influenza due to unidentified influenza virus with other respiratory manifestations: Secondary | ICD-10-CM

## 2016-12-30 MED ORDER — IBUPROFEN 400 MG PO TABS
600.0000 mg | ORAL_TABLET | Freq: Once | ORAL | Status: AC
  Administered 2016-12-30: 600 mg via ORAL
  Filled 2016-12-30: qty 1

## 2016-12-30 MED ORDER — IBUPROFEN 600 MG PO TABS
600.0000 mg | ORAL_TABLET | Freq: Four times a day (QID) | ORAL | 0 refills | Status: AC | PRN
Start: 2016-12-30 — End: ?

## 2016-12-30 MED ORDER — GUAIFENESIN-DM 100-10 MG/5ML PO SYRP: 5.0000 mL | ORAL_SOLUTION | ORAL | 0 refills | Status: AC | PRN

## 2016-12-30 MED ORDER — HYDROCOD POLST-CPM POLST ER 10-8 MG/5ML PO SUER
5.0000 mL | Freq: Once | ORAL | Status: AC
  Administered 2016-12-30: 5 mL via ORAL
  Filled 2016-12-30: qty 5

## 2016-12-30 NOTE — ED Notes (Signed)
Patient transported to X-ray 

## 2016-12-30 NOTE — Discharge Instructions (Signed)
Ibuprofen for fever and body aches. Robitussin for cough. Rest. Drink plenty of fluids. Follow up with primary care doctor in 2-3 days.

## 2016-12-30 NOTE — ED Notes (Signed)
Patient given bus pass.

## 2016-12-30 NOTE — ED Notes (Signed)
Declined W/C at D/C and was escorted to lobby by RN. 

## 2016-12-30 NOTE — ED Provider Notes (Signed)
[facility] Provider Note   CSN: [id number] Arrival date & time: 12/30/16  1249   By signing my name below, I, Clovis Pu, attest that this documentation has been prepared under the direction and in the presence of  Joshawa Dubin, PA-C. Electronically Signed: Clovis Pu, ED Scribe. 12/30/16. 2:18 PM.   History   Chief Complaint Chief Complaint  Patient presents with  . cough/congestion   The history is provided by the patient. No language interpreter was used.   HPI Comments:  Ryan Herring is a 66 y.o. male, with a hx of pneumonia, who presents to the Emergency Department complaining of productive cough with yellow sputum x 2 days. Pt also reports generalized body aches, congestion, sore throat, rhinorrhea and chills. He has taken Nyquil/Dayquil with no relief. Pt denies SOB, hx of asthma, a hx of lung problems and a hx of DM. Pt also denies getting his flu shot this year. Pt is a non-smoker. No other associated symptoms noted.   Past Medical History:  Diagnosis Date  . Alcoholism (HCC)   . Anxiety   . Cocaine abuse   . Depression   . ETOH abuse   . Hypertension     Patient Active Problem List   Diagnosis Date Noted  . HTN (hypertension) 10/16/2014    History reviewed. No pertinent surgical history.     Home Medications    Prior to Admission medications   Not on File    Family History No family history on file.  Social History Social History  Substance Use Topics  . Smoking status: Never Smoker  . Smokeless tobacco: Never Used  . Alcohol use Not on file     Comment: in rehab     Allergies   Patient has no known allergies.   Review of Systems Review of Systems  Constitutional: Positive for chills.  HENT: Positive for congestion, rhinorrhea and sore throat.   Respiratory: Positive for cough. Negative for shortness of breath.   Musculoskeletal: Positive for myalgias.     Physical Exam Updated Vital Signs BP 178/66 (BP Location:  Left Arm)   Pulse 71   Temp 97.7 F (36.5 C)   Resp 18   SpO2 98%   Physical Exam  Constitutional: He is oriented to person, place, and time. He appears well-developed and well-nourished. No distress.  HENT:  Head: Normocephalic and atraumatic.  Right Ear: External ear normal.  Left Ear: External ear normal.  Mouth/Throat: Oropharynx is clear and moist.  Clear rhinorrhea, pharynx erythemous, uvula midline  Eyes: Conjunctivae are normal.  Neck: Normal range of motion. Neck supple.  No meningeal signs  Cardiovascular: Normal rate, regular rhythm and normal heart sounds.   Pulmonary/Chest: Effort normal and breath sounds normal. No respiratory distress. He has no wheezes. He has no rales.  Abdominal: Soft. Bowel sounds are normal. He exhibits no distension. There is no tenderness.  Musculoskeletal: He exhibits no edema or tenderness.  Lymphadenopathy:    He has no cervical adenopathy.  Neurological: He is alert and oriented to person, place, and time.  Skin: Skin is warm and dry. No erythema.  Psychiatric: He has a normal mood and affect.  Nursing note and vitals reviewed.   ED Treatments / Results  DIAGNOSTIC STUDIES:  Oxygen Saturation is 98% on RA, normal by my interpretation.    COORDINATION OF CARE:  2:17 PM Discussed treatment plan with pt at bedside and pt agreed to plan.  Labs (all labs ordered are listed, but only  abnormal results are displayed) Labs Reviewed - No data to display  EKG  EKG Interpretation None       Radiology Dg Chest 2 View  Result Date: 12/30/2016 CLINICAL DATA:  Cough for 2 days.  Rhinorrhea EXAM: CHEST  2 VIEW COMPARISON:  November 10, 2013 FINDINGS: There is no edema or consolidation. Heart size and pulmonary vascularity are normal. No adenopathy. There is atherosclerotic calcification in the aorta. There is degenerative change in the thoracic spine. IMPRESSION: No edema or consolidation.  Aortic atherosclerosis. Electronically Signed    By: Bretta BangWilliam  Woodruff III M.D.   On: 12/30/2016 15:05    Procedures Procedures (including critical care time)  Medications Ordered in ED Medications  chlorpheniramine-HYDROcodone (TUSSIONEX) 10-8 MG/5ML suspension 5 mL (5 mLs Oral Given 12/30/16 1427)  ibuprofen (ADVIL,MOTRIN) tablet 600 mg (600 mg Oral Given 12/30/16 1427)     Initial Impression / Assessment and Plan / ED Course  I have reviewed the triage vital signs and the nursing notes.  Pertinent labs & imaging results that were available during my care of the patient were reviewed by me and considered in my medical decision making (see chart for details).  Clinical Course     Patient in the emergency department with cough, congestion, body aches, sore throat. Patient is in no acute distress. Patient is hypertensive, otherwise normal vital signs with normal heart rate, normal temperature. He is not hypoxic. No concern for sepsis at this time. Chest x-ray obtained and is negative. Patient feels better after receiving test in next and ibuprofen. Suspect possible influenza versus a viral upper respiratory tract infection. At this time he is stable for discharge home with outpatient symptomatic treatment. Will give prescription for Robitussin and ibuprofen. Instructed to rest, drink plenty of fluids, follow-up as needed. Return precautions discussed  Vitals:   12/30/16 1256  BP: 178/66  Pulse: 71  Resp: 18  Temp: 97.7 F (36.5 C)  SpO2: 98%     Final Clinical Impressions(s) / ED Diagnoses   Final diagnoses:  Influenza-like illness    New Prescriptions New Prescriptions   GUAIFENESIN-DEXTROMETHORPHAN (ROBITUSSIN DM) 100-10 MG/5ML SYRUP    Take 5 mLs by mouth every 4 (four) hours as needed for cough.   IBUPROFEN (ADVIL,MOTRIN) 600 MG TABLET    Take 1 tablet (600 mg total) by mouth every 6 (six) hours as needed.   I personally performed the services described in this documentation, which was scribed in my presence. The  recorded information has been reviewed and is accurate.     Jaynie Crumbleatyana Madalynn Pickelsimer, PA-C 12/30/16 1617    Canary Brimhristopher J Tegeler, MD 12/30/16 [phone]

## 2017-02-09 ENCOUNTER — Encounter: Payer: Self-pay | Admitting: Pediatric Intensive Care

## 2017-02-13 NOTE — Congregational Nurse Program (Signed)
Congregational Nurse Program Note  Date of Encounter: 03/09/2017  Past Medical History: Past Medical History:  Diagnosis Date  . Alcoholism (HCC)   . Anxiety   . Cocaine abuse   . Depression   . ETOH abuse   . Hypertension     Encounter Details:     CNP Questionnaire - 02/09/17 1101      Patient Demographics   Is this a new or existing patient? New   Patient is considered a/an Not Applicable   Race African-American/Black     Patient Assistance   Location of Patient Assistance Not Applicable   Patient's financial/insurance status Low Income;Self-Pay (Uninsured)   Uninsured Patient ([place] Card/Care Connects) Yes   Interventions Counseled to make appt. with provider;Assisted patient in making appt.   Patient referred to apply for the following financial assistance Medicaid   Food insecurities addressed Not Applicable   Transportation assistance Yes   Type of Assistance Bus Pass Given   Assistance securing medications No   Educational health offerings Acute disease;Navigating the healthcare system     Encounter Details   Primary purpose of visit Acute Illness/Condition Visit   Was an Emergency Department visit averted? Not Applicable   Does patient have a medical provider? Yes   Patient referred to Clinic   Was a mental health screening completed? (GAINS tool) No   Does patient have dental issues? No   Does patient have vision issues? No   Does your patient have an abnormal blood pressure today? No   Since previous encounter, have you referred patient for abnormal blood pressure that resulted in a new diagnosis or medication change? No   Does your patient have an abnormal blood glucose today? No   Since previous encounter, have you referred patient for abnormal blood glucose that resulted in a new diagnosis or medication change? No   Was there a life-saving intervention made? No      Client was seen in the ED on 1/16 for influenza symptoms.  Was given scripts for  symptoms but states lost them.  States still "feels bad".  Encouraged client to see his provider Lavinia SharpsMary Ann Placey NP in the [place]  Bus passes provided.

## 2017-02-15 NOTE — Congregational Nurse Program (Signed)
Congregational Nurse Program Note  Date of Encounter: 02/09/2017  Past Medical History: Past Medical History:  Diagnosis Date  . Alcoholism (HCC)   . Anxiety   . Cocaine abuse   . Depression   . ETOH abuse   . Hypertension     Encounter Details:     CNP Questionnaire - 02/09/17 0930      Patient Demographics   Is this a new or existing patient? New   Patient is considered a/an Not Applicable   Race African-American/Black     Patient Assistance   Location of Patient Assistance [place]   Patient's financial/insurance status Self-Pay (Uninsured);Low Income   Uninsured Patient ([place] Card/Care Connects) Yes   Interventions Counseled to make appt. with provider   Patient referred to apply for the following financial assistance [employer]   Food insecurities addressed Not Applicable   Transportation assistance Yes   Type of Assistance Bus Pass Given   Assistance securing medications No   Educational health offerings Navigating the healthcare system     Encounter Details   Primary purpose of visit Acute Illness/Condition Visit;Navigating the Healthcare System   Was an Emergency Department visit averted? Not Applicable   Does patient have a medical provider? Yes   Patient referred to Follow up with established PCP;Clinic   Was a mental health screening completed? (GAINS tool) No   Does patient have dental issues? No   Does patient have vision issues? No   Does your patient have an abnormal blood pressure today? No   Since previous encounter, have you referred patient for abnormal blood pressure that resulted in a new diagnosis or medication change? No   Does your patient have an abnormal blood glucose today? No   Since previous encounter, have you referred patient for abnormal blood glucose that resulted in a new diagnosis or medication change? No   Was there a life-saving intervention made? No     Client states multi day history of URI symptoms and feeling  sick. BBS CTA but HR is very irregular. Client [facility].

## 2017-04-08 NOTE — Congregational Nurse Program (Signed)
Congregational Nurse Program Note  Date of Encounter: 04/06/2017  Past Medical History: Past Medical History:  Diagnosis Date  . Alcoholism (HCC)   . Anxiety   . Cocaine abuse   . Depression   . ETOH abuse   . Hypertension     Encounter Details:     CNP Questionnaire - 04/06/17 1513      Patient Demographics   Is this a new or existing patient? Existing   Patient is considered a/an Not Applicable   Race African-American/Black     Patient Assistance   Location of Patient Assistance Not Applicable   Patient's financial/insurance status Low Income;Self-Pay (Uninsured)   Uninsured Patient ([place] Card/Care Connects) Yes   Interventions Assisted patient in making appt.;Counseled to make appt. with provider   Patient referred to apply for the following financial assistance Medicaid;[place] Card/Care Connects Renewal   Food insecurities addressed Not Applicable   Transportation assistance Yes   Type of Assistance Bus Pass Given   Assistance securing medications No   Educational health offerings Acute disease;Navigating the healthcare system     Encounter Details   Primary purpose of visit Acute Illness/Condition Visit   Was an Emergency Department visit averted? Not Applicable   Does patient have a medical provider? Yes   Patient referred to Clinic   Was a mental health screening completed? (GAINS tool) No   Does patient have dental issues? No   Does patient have vision issues? No   Does your patient have an abnormal blood pressure today? No   Since previous encounter, have you referred patient for abnormal blood pressure that resulted in a new diagnosis or medication change? No   Does your patient have an abnormal blood glucose today? No   Since previous encounter, have you referred patient for abnormal blood glucose that resulted in a new diagnosis or medication change? No   Was there a life-saving intervention made? No     client states his left knee is very painful and  requesting medication.  Knee does appear swollen and tender to touch.  Applied ice to knee and client did report that gave some relief.  Instructed client to see his provider Lavinia Sharps NP at the [facility] tomorrow for follow up and pain management.

## 2017-04-11 ENCOUNTER — Emergency Department (HOSPITAL_COMMUNITY): Payer: Self-pay

## 2017-04-11 ENCOUNTER — Emergency Department (HOSPITAL_COMMUNITY)
Admission: EM | Admit: 2017-04-11 | Discharge: 2017-04-11 | Disposition: A | Discharge status: Home / Self Care | Payer: Self-pay | Attending: Emergency Medicine | Admitting: Emergency Medicine

## 2017-04-11 ENCOUNTER — Encounter (HOSPITAL_COMMUNITY): Payer: Self-pay | Admitting: *Deleted

## 2017-04-11 DIAGNOSIS — I10 Essential (primary) hypertension: Secondary | ICD-10-CM | POA: Insufficient documentation

## 2017-04-11 DIAGNOSIS — M1712 Unilateral primary osteoarthritis, left knee: Secondary | ICD-10-CM | POA: Insufficient documentation

## 2017-04-11 DIAGNOSIS — M25561 Pain in right knee: Secondary | ICD-10-CM

## 2017-04-11 DIAGNOSIS — Z79899 Other long term (current) drug therapy: Secondary | ICD-10-CM | POA: Insufficient documentation

## 2017-04-11 DIAGNOSIS — M25461 Effusion, right knee: Secondary | ICD-10-CM | POA: Insufficient documentation

## 2017-04-11 MED ORDER — IBUPROFEN 800 MG PO TABS
800.0000 mg | ORAL_TABLET | Freq: Once | ORAL | Status: AC
  Administered 2017-04-11: 800 mg via ORAL
  Filled 2017-04-11: qty 1

## 2017-04-11 MED ORDER — DICLOFENAC SODIUM 75 MG PO TBEC: 75.0000 mg | DELAYED_RELEASE_TABLET | Freq: Two times a day (BID) | ORAL | 0 refills | Status: AC

## 2017-04-11 NOTE — Discharge Instructions (Signed)
Schedule to see the Orthopedist for evalaution

## 2017-04-11 NOTE — ED Notes (Signed)
Declined W/C at D/C and was escorted to lobby by RN. 

## 2017-04-11 NOTE — ED Triage Notes (Signed)
PT presents with Pain and swelling in RT knee.

## 2017-04-11 NOTE — ED Provider Notes (Signed)
[facility] Provider Note   CSN: [id number] Arrival date & time: 04/11/17  4098   By signing my name below, I, Majel Homer, attest that this documentation has been prepared under the direction and in the presence of non-physician practitioner, Ok Edwards, PA-C. Electronically Signed: Majel Homer, Scribe. 04/11/2017. 9:23 AM.  History   Chief Complaint Chief Complaint  Patient presents with  . Knee Pain   The history is provided by the patient. No language interpreter was used.   HPI Comments: Ryan Herring is a 66 y.o. male with PMHx of HTN, cocaine abuse, and alcoholism, who presents to the Emergency Department complaining of gradually worsening, right knee pain that began yesterday evening. Pt describes his pain as an "aching" sensation and states he has been unable to ambulate or bear weight without difficulty. He believes he may have arthritis but notes this has not been diagnosed in a clinical setting. He reports he has not taken any medication to relieve his pain. Pt denies any recent trauma/injury to his right knee, increase in squatting or bending movements, and any other complaints.   Past Medical History:  Diagnosis Date  . Alcoholism (HCC)   . Anxiety   . Cocaine abuse   . Depression   . ETOH abuse   . Hypertension    Patient Active Problem List   Diagnosis Date Noted  . HTN (hypertension) 10/16/2014   History reviewed. No pertinent surgical history.  Home Medications    Prior to Admission medications   Medication Sig Start Date End Date Taking? Authorizing Provider  guaiFENesin-dextromethorphan (ROBITUSSIN DM) 100-10 MG/5ML syrup Take 5 mLs by mouth every 4 (four) hours as needed for cough. 12/30/16   Tatyana Kirichenko, PA-C  ibuprofen (ADVIL,MOTRIN) 600 MG tablet Take 1 tablet (600 mg total) by mouth every 6 (six) hours as needed. 12/30/16   Tatyana Kirichenko, PA-C  Pseudoephedrine-APAP-DM (DAYQUIL PO) Take 2 capsules by mouth daily as needed (for  congestion).    Historical Provider, MD   Family History History reviewed. No pertinent family history.  Social History Social History  Substance Use Topics  . Smoking status: Never Smoker  . Smokeless tobacco: Never Used  . Alcohol use Yes     Comment: PT last drank beer on Friday 4-27   Allergies   Patient has no known allergies.  Review of Systems Review of Systems  Constitutional: Negative for fever.  Musculoskeletal: Positive for arthralgias and joint swelling.   Physical Exam Updated Vital Signs BP (!) 127/98 (BP Location: Right Arm)   Pulse 63   Temp 97.8 F (36.6 C) (Oral)   Resp 16   SpO2 99%   Physical Exam  Constitutional: He is oriented to person, place, and time. He appears well-developed and well-nourished.  HENT:  Head: Normocephalic.  Eyes: EOM are normal.  Neck: Normal range of motion.  Pulmonary/Chest: Effort normal.  Abdominal: He exhibits no distension.  Musculoskeletal: Normal range of motion. He exhibits edema.  Effusion to right knee, stable, no medial or lateral instability. Negative drawer sign.   Neurological: He is alert and oriented to person, place, and time.  Psychiatric: He has a normal mood and affect.  Nursing note and vitals reviewed.  ED Treatments / Results  DIAGNOSTIC STUDIES:  Oxygen Saturation is 99% on RA, normal by my interpretation.    COORDINATION OF CARE:  9:21 AM Discussed treatment plan with pt at bedside and pt agreed to plan.  Labs (all labs ordered are listed, but only  abnormal results are displayed) Labs Reviewed - No data to display  EKG  EKG Interpretation None      Radiology No results found.  Procedures Procedures (including critical care time)  Medications Ordered in ED Medications - No data to display  Initial Impression / Assessment and Plan / ED Course  I have reviewed the triage vital signs and the nursing notes.  Pertinent labs & imaging results that were available during my care of  the patient were reviewed by me and considered in my medical decision making (see chart for details).      Final Clinical Impressions(s) / ED Diagnoses   Final diagnoses:  Acute pain of right knee  Primary osteoarthritis of left knee    New Prescriptions Discharge Medication List as of 04/11/2017 10:44 AM    START taking these medications   Details  diclofenac (VOLTAREN) 75 MG EC tablet Take 1 tablet (75 mg total) by mouth 2 (two) times daily., Starting Sat 04/11/2017, Print      knee sleeve An After Visit Summary was printed and given to the patient.  I personally performed the services in this documentation, which was scribed in my presence.  The recorded information has been reviewed and considered.   Barnet Pall.   Lonia Skinner Thorofare, PA-C 04/11/17 1126    Doug Sou, MD 04/11/17 [phone]

## 2017-04-13 ENCOUNTER — Encounter: Payer: Self-pay | Admitting: Pediatric Intensive Care

## 2017-04-13 MED FILL — DICLOFENAC SOD 75 MG TAB EC: 75 | 10 days supply | Qty: 20 | Fill #0

## 2017-04-13 NOTE — Congregational Nurse Program (Signed)
Congregational Nurse Program Note  Date of Encounter: 04/13/2017  Past Medical History: Past Medical History:  Diagnosis Date  . Alcoholism (HCC)   . Anxiety   . Cocaine abuse   . Depression   . ETOH abuse   . Hypertension     Encounter Details:     CNP Questionnaire - 04/13/17 0915      Patient Demographics   Is this a new or existing patient? New   Patient is considered a/an Not Applicable   Race African-American/Black     Patient Assistance   Location of Patient Assistance [facility]   Patient's financial/insurance status Self-Pay (Uninsured);Low Income   Uninsured Patient ([place] Card/Care Connects) Yes   Interventions Assisted patient in making appt.;Counseled to make appt. with provider   Patient referred to apply for the following financial assistance [facility]   Food insecurities addressed Not Applicable   Transportation assistance No   Type of Assistance Other   Assistance securing medications Yes   Type of Assistance [facility]   Educational health offerings Medications;Navigating the healthcare system     Encounter Details   Primary purpose of visit Acute Illness/Condition Visit;Post ED/Hospitalization Visit   Was an Emergency Department visit averted? Not Applicable   Does patient have a medical provider? Yes   Patient referred to Follow up with established PCP;Clinic   Was a mental health screening completed? (GAINS tool) No   Does patient have dental issues? No   Does patient have vision issues? No   Does your patient have an abnormal blood pressure today? No   Since previous encounter, have you referred patient for abnormal blood pressure that resulted in a new diagnosis or medication change? No   Does your patient have an abnormal blood glucose today? No   Since previous encounter, have you referred patient for abnormal blood glucose that resulted in a new diagnosis or medication change? No   Was there a life-saving  intervention made? No     Client was seen in ED for right knee pain. He has prescription for Voltaren and needs assistance with follow up at orthopedics clinic. CN will assist with [facility] Financial application.

## 2017-05-21 NOTE — Congregational Nurse Program (Signed)
Congregational Nurse Program Note  Date of Encounter: 04/24/2017  Past Medical History: Past Medical History:  Diagnosis Date  . Alcoholism (HCC)   . Anxiety   . Cocaine abuse   . Depression   . ETOH abuse   . Hypertension     Encounter Details:     CNP Questionnaire - 04/24/17 1819      Patient Demographics   Is this a new or existing patient? New   Patient is considered a/an Not Applicable   Race African-American/Black     Patient Assistance   Location of Patient Assistance Not Applicable   Patient's financial/insurance status Low Income;[facility] Card/Care Connects;Self-Pay (Uninsured)   Uninsured Patient ([place] Card/Care Connects) Yes   Interventions Not Applicable   Patient referred to apply for the following financial assistance Not Applicable   Food insecurities addressed Not Applicable   Transportation assistance No   Assistance securing medications No   Educational health offerings Other     Encounter Details   Primary purpose of visit Other   Was an Emergency Department visit averted? Not Applicable   Does patient have a medical provider? Yes   Patient referred to Other   Was a mental health screening completed? (GAINS tool) No   Does patient have dental issues? Yes   Was a dental referral made? Yes   Does patient have vision issues? No   Does your patient have an abnormal blood pressure today? No   Since previous encounter, have you referred patient for abnormal blood pressure that resulted in a new diagnosis or medication change? No   Does your patient have an abnormal blood glucose today? No   Since previous encounter, have you referred patient for abnormal blood glucose that resulted in a new diagnosis or medication change? No   Was there a life-saving intervention made? No     Client requested referral for the upcoming dental clinic. Presented orange card.  Referral placed

## 2017-06-22 ENCOUNTER — Encounter (INDEPENDENT_AMBULATORY_CARE_PROVIDER_SITE_OTHER): Payer: Self-pay | Admitting: Orthopaedic Surgery

## 2017-06-22 ENCOUNTER — Ambulatory Visit (INDEPENDENT_AMBULATORY_CARE_PROVIDER_SITE_OTHER): Payer: Medicare Other | Admitting: Orthopaedic Surgery

## 2017-06-22 DIAGNOSIS — M1711 Unilateral primary osteoarthritis, right knee: Secondary | ICD-10-CM | POA: Diagnosis not present

## 2017-06-22 NOTE — Progress Notes (Signed)
Office Visit Note   Patient: Ryan Herring           Date of Birth: [birth year]           MRN: [id number] Visit Date: 06/22/2017              Requested by: Lavinia Sharps, NP [address] [place], [place] [address] PCP: Lavinia Sharps, NP   Assessment & Plan: Visit Diagnoses:  1. Primary osteoarthritis of right knee     Plan: Patient has degenerative arthritis of the right knee. Worst in the patellofemoral compartment. Cortisone injection was performed today. Discussed other treatment options. Follow-up with me as needed.  Follow-Up Instructions: Return if symptoms worsen or fail to improve.   Orders:  Orders Placed This Encounter  Procedures  . Ambulatory referral to [facility]   No orders of the defined types were placed in this encounter.     Procedures: Large Joint Inj Date/Time: 06/22/2017 2:13 PM Performed by: Tarry Kos Authorized by: Tarry Kos   Consent Given by:  Patient Timeout: prior to procedure the correct patient, procedure, and site was verified   Indications:  Pain Location:  Knee Site:  R knee Prep: patient was prepped and draped in usual sterile fashion   Needle Size:  22 G Ultrasound Guidance: No   Fluoroscopic Guidance: No   Arthrogram: No   Patient tolerance:  Patient tolerated the procedure well with no immediate complications     Clinical Data: No additional findings.   Subjective: Chief Complaint  Patient presents with  . Right Knee - Pain    Patient is 66 year old gentleman with chronic right knee pain of 8 out of 10 for several months. He denies any injuries. He denies previous surgeries. He endorses a chronic throbbing aching pain that affects his ADLs. He's been wearing a neoprene brace. He denies any mechanical symptoms. Denies any radiation of pain.    Review of Systems  Constitutional: Negative.   All other systems reviewed and are negative.    Objective: Vital Signs: There were no vitals taken for  this visit.  Physical Exam  Constitutional: He is oriented to person, place, and time. He appears well-developed and well-nourished.  HENT:  Head: Normocephalic and atraumatic.  Eyes: Pupils are equal, round, and reactive to light.  Neck: Neck supple.  Pulmonary/Chest: Effort normal.  Abdominal: Soft.  Musculoskeletal: Normal range of motion.  Neurological: He is alert and oriented to person, place, and time.  Skin: Skin is warm.  Psychiatric: He has a normal mood and affect. His behavior is normal. Judgment and thought content normal.  Nursing note and vitals reviewed.   Ortho Exam  Right knee exam shows no joint effusion. He has excellent range of motion. Collaterals and cruciates are stable. He does have multiple excoriations on his pretibial surface from scratching.  Specialty Comments:  No specialty comments available.  Imaging: No results found.   PMFS History: Patient Active Problem List   Diagnosis Date Noted  . HTN (hypertension) 10/16/2014   Past Medical History:  Diagnosis Date  . Alcoholism (HCC)   . Anxiety   . Cocaine abuse   . Depression   . ETOH abuse   . Hypertension     No family history on file.  No past surgical history on file. Social History   Occupational History  . Not on file.   Social History Main Topics  . Smoking status: Never Smoker  . Smokeless tobacco:  Never Used  . Alcohol use Yes     Comment: PT last drank beer on Friday 4-27  . Drug use: No     Comment: in rehab  . Sexual activity: Not on file

## 2017-11-18 ENCOUNTER — Other Ambulatory Visit: Payer: Self-pay

## 2017-11-18 ENCOUNTER — Encounter (HOSPITAL_COMMUNITY): Payer: Self-pay

## 2017-11-18 DIAGNOSIS — I872 Venous insufficiency (chronic) (peripheral): Secondary | ICD-10-CM | POA: Insufficient documentation

## 2017-11-18 DIAGNOSIS — M79605 Pain in left leg: Secondary | ICD-10-CM | POA: Diagnosis present

## 2017-11-18 DIAGNOSIS — G8929 Other chronic pain: Secondary | ICD-10-CM | POA: Diagnosis not present

## 2017-11-18 DIAGNOSIS — M79604 Pain in right leg: Secondary | ICD-10-CM | POA: Insufficient documentation

## 2017-11-18 DIAGNOSIS — I10 Essential (primary) hypertension: Secondary | ICD-10-CM | POA: Diagnosis not present

## 2017-11-18 NOTE — ED Triage Notes (Signed)
Pt states that this week he began to have bilateral leg pain, has scars on his legs that have been there for years, states if he scratches his legs they feel better, no swelling noted.

## 2017-11-19 ENCOUNTER — Emergency Department (HOSPITAL_COMMUNITY)
Admission: EM | Admit: 2017-11-19 | Discharge: 2017-11-19 | Disposition: A | Discharge status: Home / Self Care | Payer: Medicare Other | Attending: Emergency Medicine | Admitting: Emergency Medicine

## 2017-11-19 DIAGNOSIS — M79604 Pain in right leg: Secondary | ICD-10-CM

## 2017-11-19 DIAGNOSIS — I872 Venous insufficiency (chronic) (peripheral): Secondary | ICD-10-CM

## 2017-11-19 DIAGNOSIS — M79605 Pain in left leg: Secondary | ICD-10-CM

## 2017-11-19 DIAGNOSIS — G8929 Other chronic pain: Secondary | ICD-10-CM

## 2017-11-19 MED ORDER — GABAPENTIN 300 MG PO CAPS: 300.0000 mg | ORAL_CAPSULE | Freq: Three times a day (TID) | ORAL | 0 refills | Status: AC

## 2017-11-19 NOTE — Discharge Instructions (Signed)
To find a primary care or specialty doctor please call [phone] or [phone] to access "[facility] Find a Doctor Service." ° °You may also go on the [facility] website at [email or web address] ° °There are also multiple [facility], [facility], [facility] and [facility] practices throughout the [place] that are frequently accepting new patients. You may find a clinic that is close to your home and contact them. ° °[facility] -  °[address] °[place] [address] °[phone] ° ° °[facility] -  °[address] °[place] [address] [phone] ° ° °[facility] - °[address]  °[place] [address] °[phone] ° ° °

## 2017-11-19 NOTE — ED Provider Notes (Signed)
TIME SEEN: 5:21 AM  CHIEF COMPLAINT: Bilateral lower extremity pain  HPI: Patient is a 66 year old male with history of substance abuse, hypertension who presents to the emergency department with complaints of bilateral lower extremity pain feels like a burning pain that he has had for 5 years.  It is unclear what prompted him to come to the emergency department tonight for this.  He denies any significant change.  No injury to his legs.  Also reports that he has a "rash" on the anterior aspect of both legs that he has had also had for several years.  He states it is pruritic in nature.  No redness, warmth or drainage.  He told nursing staff that he had a fever in triage but he denies this to me.  No numbness or focal weakness.  No calf pain or calf tenderness.  ROS: See HPI Constitutional: no fever  Eyes: no drainage  ENT: no runny nose   Cardiovascular:  no chest pain  Resp: no SOB  GI: no vomiting GU: no dysuria Integumentary: no rash  Allergy: no hives  Musculoskeletal: no leg swelling  Neurological: no slurred speech ROS otherwise negative  PAST MEDICAL HISTORY/PAST SURGICAL HISTORY:  Past Medical History:  Diagnosis Date  . Alcoholism (HCC)   . Anxiety   . Cocaine abuse (HCC)   . Depression   . ETOH abuse   . Hypertension     MEDICATIONS:  Prior to Admission medications   Medication Sig Start Date End Date Taking? Authorizing Provider  diclofenac (VOLTAREN) 75 MG EC tablet Take 1 tablet (75 mg total) by mouth 2 (two) times daily. Patient not taking: Reported on 06/22/2017 04/11/17   Elson AreasSofia, Leslie K, PA-C  guaiFENesin-dextromethorphan [facility](ROBITUSSIN DM) 100-10 MG/5ML syrup Take 5 mLs by mouth every 4 (four) hours as needed for cough. Patient not taking: Reported on 06/22/2017 12/30/16   Jaynie CrumbleKirichenko, Tatyana, PA-C  ibuprofen (ADVIL,MOTRIN) 600 MG tablet Take 1 tablet (600 mg total) by mouth every 6 (six) hours as needed. 12/30/16   Kirichenko, Tatyana, PA-C  Pseudoephedrine-APAP-DM  (DAYQUIL PO) Take 2 capsules by mouth daily as needed (for congestion).    [provider]    ALLERGIES:  No Known Allergies  SOCIAL HISTORY:  Social History   Tobacco Use  . Smoking status: Never Smoker  . Smokeless tobacco: Never Used  Substance Use Topics  . Alcohol use: Yes    Comment: PT last drank beer on Friday 4-27    FAMILY HISTORY: No family history on file.  EXAM: BP 109/63   Pulse 81   Temp 97.9 F (36.6 C)   Resp 20   Ht 6\' 5"  (1.956 m)   Wt 127 kg (280 lb)   SpO2 93%   BMI 33.20 kg/m  CONSTITUTIONAL: Alert and oriented and responds appropriately to questions. Well-appearing; well-nourished HEAD: Normocephalic EYES: Conjunctivae clear, pupils appear equal, EOMI ENT: normal nose; moist mucous membranes NECK: Supple, no meningismus, no nuchal rigidity, no LAD  CARD: RRR; S1 and S2 appreciated; no murmurs, no clicks, no rubs, no gallops RESP: Normal chest excursion without splinting or tachypnea; breath sounds clear and equal bilaterally; no wheezes, no rhonchi, no rales, no hypoxia or respiratory distress, speaking full sentences ABD/GI: Normal bowel sounds; non-distended; soft, non-tender, no rebound, no guarding, no peritoneal signs, no hepatosplenomegaly BACK:  The back appears normal and is non-tender to palpation, there is no CVA tenderness EXT: Normal ROM in all joints; non-tender to palpation; no edema; normal capillary refill; no  cyanosis, no calf tenderness or swelling, no bony tenderness or bony deformity.  Compartments are soft.  Extremities are warm and well perfused.  2+ palpable DP pulses bilaterally.    SKIN: Normal color for age and race; warm; no rash, skin changes to the anterior aspect of patient's bilateral lower extremities over the shins consistent with venous stasis dermatitis but no redness, warmth, induration, fluctuance or purulent drainage, no open wounds NEURO: Moves all extremities equally, normal sensation diffusely PSYCH:  The patient's mood and manner are appropriate. Grooming and personal hygiene are appropriate.  MEDICAL DECISION MAKING: Patient here with complaints of chronic leg pain.  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  No sign of cellulitis, abscess, septic arthritis, gout, arterial obstruction, DVT, compartment syndrome, bony injury on exam.  He is ambulatory.  Neurovascularly intact distally.  He has history of substance abuse.  Given this is chronic pain I do not feel that it is appropriate to treat his pain with narcotics especially given his history of substance abuse.  I will start him on gabapentin and give him follow-up with a primary care provider.  I do not feel he needs any further emergent workup at this time.  At this time, I do not feel there is any life-threatening condition present. I have reviewed and discussed all results (EKG, imaging, lab, urine as appropriate) and exam findings with patient/family. I have reviewed nursing notes and appropriate previous records.  I feel the patient is safe to be discharged home without further emergent workup and can continue workup as an outpatient as needed. Discussed usual and customary return precautions. Patient/family verbalize understanding and are comfortable with this plan.  Outpatient follow-up has been provided if needed. All questions have been answered.      Karo Rog, Layla MawKristen N, DO 11/19/17 [phone]

## 2017-12-12 ENCOUNTER — Emergency Department (HOSPITAL_COMMUNITY): Payer: Medicare Other

## 2017-12-12 ENCOUNTER — Other Ambulatory Visit: Payer: Self-pay

## 2017-12-12 ENCOUNTER — Emergency Department (HOSPITAL_COMMUNITY)
Admission: EM | Admit: 2017-12-12 | Discharge: 2017-12-12 | Disposition: A | Discharge status: Home / Self Care | Payer: Medicare Other | Attending: Emergency Medicine | Admitting: Emergency Medicine

## 2017-12-12 ENCOUNTER — Encounter (HOSPITAL_COMMUNITY): Payer: Self-pay

## 2017-12-12 DIAGNOSIS — Y999 Unspecified external cause status: Secondary | ICD-10-CM | POA: Diagnosis not present

## 2017-12-12 DIAGNOSIS — W101XXA Fall (on)(from) sidewalk curb, initial encounter: Secondary | ICD-10-CM | POA: Diagnosis not present

## 2017-12-12 DIAGNOSIS — Z79899 Other long term (current) drug therapy: Secondary | ICD-10-CM | POA: Diagnosis not present

## 2017-12-12 DIAGNOSIS — Y939 Activity, unspecified: Secondary | ICD-10-CM | POA: Insufficient documentation

## 2017-12-12 DIAGNOSIS — S32019A Unspecified fracture of first lumbar vertebra, initial encounter for closed fracture: Secondary | ICD-10-CM | POA: Insufficient documentation

## 2017-12-12 DIAGNOSIS — Y92480 Sidewalk as the place of occurrence of the external cause: Secondary | ICD-10-CM | POA: Diagnosis not present

## 2017-12-12 DIAGNOSIS — S32000A Wedge compression fracture of unspecified lumbar vertebra, initial encounter for closed fracture: Secondary | ICD-10-CM

## 2017-12-12 DIAGNOSIS — I10 Essential (primary) hypertension: Secondary | ICD-10-CM | POA: Diagnosis not present

## 2017-12-12 MED ORDER — HYDROCODONE-ACETAMINOPHEN 5-325 MG PO TABS: ORAL_TABLET | ORAL | 0 refills | Status: AC

## 2017-12-12 MED ORDER — HYDROCODONE-ACETAMINOPHEN 5-325 MG PO TABS
1.0000 | ORAL_TABLET | Freq: Once | ORAL | Status: AC
  Administered 2017-12-12: 1 via ORAL
  Filled 2017-12-12: qty 1

## 2017-12-12 MED ORDER — LIDOCAINE 5 % EX PTCH
1.0000 | MEDICATED_PATCH | CUTANEOUS | Status: DC
  Administered 2017-12-12: 1 via TRANSDERMAL
  Filled 2017-12-12: qty 1
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
## 2017-12-12 NOTE — ED Notes (Signed)
BUSS PASS GIVEN

## 2017-12-12 NOTE — ED Notes (Signed)
PT [facility]

## 2017-12-12 NOTE — ED Notes (Signed)
Patient transported to X-ray 
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
## 2017-12-12 NOTE — Discharge Instructions (Signed)
Take vicodin for breakthrough pain, do not drink alcohol, drive, care for children or do other critical tasks while taking vicodin. ° °Please follow with your primary care doctor in the next 2 days for a check-up. They must obtain records for further management.  ° °Do not hesitate to return to the Emergency Department for any new, worsening or concerning symptoms.  ° °

## 2017-12-12 NOTE — ED Notes (Signed)
WARM COMPRESS APPLIED TO AREAS PT REQUESTED

## 2017-12-12 NOTE — ED Notes (Signed)
EDPA Provider at bedside. UPDATED PT AND FAMILY AT DISCHARGE

## 2017-12-14 ENCOUNTER — Emergency Department (HOSPITAL_COMMUNITY)
Admission: EM | Admit: 2017-12-14 | Discharge: 2017-12-14 | Disposition: A | Discharge status: Home / Self Care | Payer: Medicare Other | Attending: Emergency Medicine | Admitting: Emergency Medicine

## 2017-12-14 ENCOUNTER — Encounter (HOSPITAL_COMMUNITY): Payer: Self-pay | Admitting: Emergency Medicine

## 2017-12-14 DIAGNOSIS — M62838 Other muscle spasm: Secondary | ICD-10-CM | POA: Diagnosis not present

## 2017-12-14 DIAGNOSIS — S3982XD Other specified injuries of lower back, subsequent encounter: Secondary | ICD-10-CM | POA: Diagnosis present

## 2017-12-14 DIAGNOSIS — S32000D Wedge compression fracture of unspecified lumbar vertebra, subsequent encounter for fracture with routine healing: Secondary | ICD-10-CM | POA: Diagnosis not present

## 2017-12-14 DIAGNOSIS — Z79899 Other long term (current) drug therapy: Secondary | ICD-10-CM | POA: Diagnosis not present

## 2017-12-14 DIAGNOSIS — Y998 Other external cause status: Secondary | ICD-10-CM | POA: Diagnosis not present

## 2017-12-14 DIAGNOSIS — Y9389 Activity, other specified: Secondary | ICD-10-CM | POA: Diagnosis not present

## 2017-12-14 DIAGNOSIS — Y9289 Other specified places as the place of occurrence of the external cause: Secondary | ICD-10-CM | POA: Insufficient documentation

## 2017-12-14 DIAGNOSIS — I10 Essential (primary) hypertension: Secondary | ICD-10-CM | POA: Diagnosis not present

## 2017-12-14 MED ORDER — HYDROCODONE-ACETAMINOPHEN 5-325 MG PO TABS
1.0000 | ORAL_TABLET | Freq: Once | ORAL | Status: AC
  Administered 2017-12-14: 1 via ORAL
  Filled 2017-12-14: qty 1

## 2017-12-14 MED ORDER — LIDOCAINE 5 % EX PTCH
1.0000 | MEDICATED_PATCH | Freq: Once | CUTANEOUS | Status: DC
  Administered 2017-12-14: 1 via TRANSDERMAL
  Filled 2017-12-14: qty 1

## 2017-12-14 MED ORDER — HYDROCODONE-ACETAMINOPHEN 5-325 MG PO TABS: 1.0000 | ORAL_TABLET | ORAL | 0 refills | Status: AC | PRN

## 2017-12-14 MED ORDER — CYCLOBENZAPRINE HCL 10 MG PO TABS: 10.0000 mg | ORAL_TABLET | Freq: Two times a day (BID) | ORAL | 0 refills | Status: AC | PRN
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
## 2017-12-14 NOTE — ED Notes (Signed)
Pt staets he understands instrucions,home stable with wife.

## 2017-12-14 NOTE — Discharge Instructions (Signed)
It is very important that you keep track of your discharge paperwork and prescriptions, and call to schedule follow-up appointment with neurosurgery.  You will not be provided additional prescriptions for pain medication from the emergency department.  If you have significantly worsened back pain, with numbness or weakness in your legs, difficulty walking, or inability to control your bowel or bladder please return to the emergency department immediately for reevaluation.  These medications may make you drowsy do not drive or operate any machinery.
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
## 2018-02-11 ENCOUNTER — Emergency Department (HOSPITAL_COMMUNITY)
Admission: EM | Admit: 2018-02-11 | Discharge: 2018-02-11 | Disposition: A | Discharge status: Home / Self Care | Payer: Medicare Other | Attending: Emergency Medicine | Admitting: Emergency Medicine

## 2018-02-11 ENCOUNTER — Other Ambulatory Visit: Payer: Self-pay

## 2018-02-11 ENCOUNTER — Encounter (HOSPITAL_COMMUNITY): Payer: Self-pay

## 2018-02-11 ENCOUNTER — Emergency Department (HOSPITAL_COMMUNITY): Payer: Medicare Other

## 2018-02-11 DIAGNOSIS — R3129 Other microscopic hematuria: Secondary | ICD-10-CM

## 2018-02-11 DIAGNOSIS — R05 Cough: Secondary | ICD-10-CM | POA: Diagnosis present

## 2018-02-11 DIAGNOSIS — Z79899 Other long term (current) drug therapy: Secondary | ICD-10-CM | POA: Diagnosis not present

## 2018-02-11 DIAGNOSIS — J181 Lobar pneumonia, unspecified organism: Secondary | ICD-10-CM

## 2018-02-11 DIAGNOSIS — I10 Essential (primary) hypertension: Secondary | ICD-10-CM | POA: Diagnosis not present

## 2018-02-11 DIAGNOSIS — J189 Pneumonia, unspecified organism: Secondary | ICD-10-CM | POA: Diagnosis not present

## 2018-02-11 DIAGNOSIS — N3090 Cystitis, unspecified without hematuria: Secondary | ICD-10-CM | POA: Diagnosis not present

## 2018-02-11 DIAGNOSIS — R7989 Other specified abnormal findings of blood chemistry: Secondary | ICD-10-CM | POA: Insufficient documentation

## 2018-02-11 DIAGNOSIS — R748 Abnormal levels of other serum enzymes: Secondary | ICD-10-CM

## 2018-02-11 LAB — COMPREHENSIVE METABOLIC PANEL
ALBUMIN: 2.8 g/dL — AB (ref 3.5–5.0)
ALT: 23 U/L (ref 17–63)
AST: 37 U/L (ref 15–41)
Alkaline Phosphatase: 61 U/L (ref 38–126)
Anion gap: 16 — ABNORMAL HIGH (ref 5–15)
BUN: 26 mg/dL — ABNORMAL HIGH (ref 6–20)
CALCIUM: 8.7 mg/dL — AB (ref 8.9–10.3)
CHLORIDE: 97 mmol/L — AB (ref 101–111)
CO2: 21 mmol/L — AB (ref 22–32)
Creatinine, Ser: 1.82 mg/dL — ABNORMAL HIGH (ref 0.61–1.24)
GFR calc non Af Amer: 37 mL/min — ABNORMAL LOW (ref >60)
GFR, EST AFRICAN AMERICAN: 43 mL/min — AB (ref >60)
GLUCOSE: 158 mg/dL — AB (ref 65–99)
Potassium: 3.2 mmol/L — ABNORMAL LOW (ref 3.5–5.1)
SODIUM: 134 mmol/L — AB (ref 135–145)
Total Bilirubin: 1.3 mg/dL — ABNORMAL HIGH (ref 0.3–1.2)
Total Protein: 8.2 g/dL — ABNORMAL HIGH (ref 6.5–8.1)

## 2018-02-11 LAB — URINALYSIS, MICROSCOPIC (REFLEX)

## 2018-02-11 LAB — BRAIN NATRIURETIC PEPTIDE: B Natriuretic Peptide: 60 pg/mL (ref 0.0–100.0)

## 2018-02-11 LAB — CBC WITH DIFFERENTIAL/PLATELET
BASOS ABS: 0 10*3/uL (ref 0.0–0.1)
BASOS PCT: 0 %
EOS PCT: 0 %
Eosinophils Absolute: 0 10*3/uL (ref 0.0–0.7)
HEMATOCRIT: 38.7 % — AB (ref 39.0–52.0)
Hemoglobin: 13 g/dL (ref 13.0–17.0)
LYMPHS PCT: 7 %
Lymphs Abs: 0.8 10*3/uL (ref 0.7–4.0)
MCH: 27.3 pg (ref 26.0–34.0)
MCHC: 33.6 g/dL (ref 30.0–36.0)
MCV: 81.3 fL (ref 78.0–100.0)
MONO ABS: 1.6 10*3/uL — AB (ref 0.1–1.0)
Monocytes Relative: 13 %
NEUTROS ABS: 9.7 10*3/uL — AB (ref 1.7–7.7)
Neutrophils Relative %: 80 %
PLATELETS: 243 10*3/uL (ref 150–400)
RBC: 4.76 MIL/uL (ref 4.22–5.81)
RDW: 14.8 % (ref 11.5–15.5)
WBC: 12.1 10*3/uL — AB (ref 4.0–10.5)

## 2018-02-11 LAB — URINALYSIS, ROUTINE W REFLEX MICROSCOPIC
GLUCOSE, UA: NEGATIVE mg/dL
KETONES UR: NEGATIVE mg/dL
LEUKOCYTES UA: NEGATIVE
NITRITE: NEGATIVE
PROTEIN: 100 mg/dL — AB
Specific Gravity, Urine: 1.025 (ref 1.005–1.030)
pH: 5 (ref 5.0–8.0)

## 2018-02-11 MED ORDER — LEVOFLOXACIN 750 MG PO TABS
750.0000 mg | ORAL_TABLET | Freq: Once | ORAL | Status: AC
  Administered 2018-02-11: 750 mg via ORAL
  Filled 2018-02-11: qty 1

## 2018-02-11 MED ORDER — AMOXICILLIN 250 MG PO CHEW
1000.0000 mg | CHEWABLE_TABLET | Freq: Three times a day (TID) | ORAL | Status: DC
  Filled 2018-02-11: qty 4

## 2018-02-11 MED ORDER — CEFTRIAXONE SODIUM 1 G IJ SOLR
1.0000 g | Freq: Once | INTRAMUSCULAR | Status: DC
Start: 2018-02-11 — End: 2018-02-11

## 2018-02-11 MED ORDER — AZITHROMYCIN 250 MG PO TABS: 500.0000 mg | ORAL_TABLET | Freq: Once | ORAL | Status: DC

## 2018-02-11 MED ORDER — LEVOFLOXACIN 750 MG PO TABS: 750.0000 mg | ORAL_TABLET | ORAL | 0 refills | Status: AC

## 2018-02-11 NOTE — ED Notes (Signed)
ED Provider at bedside. 

## 2018-02-11 NOTE — ED Notes (Addendum)
D/c reviewed with patient and partner. Encouraged to take ALL prescribed med as ordered and follow up with PCP NO E-SIGNATURE AVAILABLE

## 2018-02-11 NOTE — Discharge Instructions (Signed)
We saw in the ER for cough and discolored urine. Your chest x-ray confirms a pneumonia.  The urine does have some markers for infection.  We are sending you home with antibiotics that will treat both the pneumonia and a UTI.  It is prudent that you establish a primary care doctor for close follow-up, and management of your conditions. Return to the ER if the symptoms get worse.

## 2018-02-11 NOTE — ED Provider Notes (Signed)
[facility] EMERGENCY DEPARTMENT Provider Note   CSN: [id number] Arrival date & time: 02/11/18  8295     History   Chief Complaint Chief Complaint  Patient presents with  . Cough    HPI Ryan Herring is a 67 y.o. male.  HPI  67 year old male comes in with chief complaint of discolored urine and cough. Patient has remote history of cocaine abuse and alcohol abuse.  Patient does not have a primary care doctor.  He states that over the past 3 weeks he has been having progressively worsening cough with clear phlegm.  Patient is also having chest pain and shortness of breath with cough.  Patient denies any associated nausea, vomiting, fevers, chills.  Review of system is positive for discolored urine, but patient denies any pain with urination or frequent urination.  Past Medical History:  Diagnosis Date  . Alcoholism (HCC)   . Anxiety   . Cocaine abuse (HCC)   . Depression   . ETOH abuse   . Hypertension     Patient Active Problem List   Diagnosis Date Noted  . HTN (hypertension) 10/16/2014    History reviewed. No pertinent surgical history.     Home Medications    Prior to Admission medications   Medication Sig Start Date End Date Taking? Authorizing Provider  cyclobenzaprine (FLEXERIL) 10 MG tablet Take 1 tablet (10 mg total) by mouth 2 (two) times daily as needed for muscle spasms. 12/14/17   Dartha Lodge, PA-C  diclofenac (VOLTAREN) 75 MG EC tablet Take 1 tablet (75 mg total) by mouth 2 (two) times daily. Patient not taking: Reported on 06/22/2017 04/11/17   Elson Areas, PA-C  gabapentin (NEURONTIN) 300 MG capsule Take 1 capsule (300 mg total) by mouth 3 (three) times daily. Start with 1 300 mg tablet once a day for 3 days then go up to 1 300 mg tablet twice a day for 3 days then take 1 300 mg tablet 3 times a day after that. 11/19/17   Ward, Layla Maw, DO  guaiFENesin-dextromethorphan (ROBITUSSIN DM) 100-10 MG/5ML syrup Take 5 mLs by mouth  every 4 (four) hours as needed for cough. Patient not taking: Reported on 06/22/2017 12/30/16   Jaynie Crumble, PA-C  HYDROcodone-acetaminophen (NORCO/VICODIN) 5-325 MG tablet Take 1-2 tablets by mouth every 6 hours as needed for pain and/or cough. 12/12/17   Pisciotta, Joni Reining, PA-C  HYDROcodone-acetaminophen (NORCO/VICODIN) 5-325 MG tablet Take 1 tablet by mouth every 4 (four) hours as needed. 12/14/17   Dartha Lodge, PA-C  ibuprofen (ADVIL,MOTRIN) 600 MG tablet Take 1 tablet (600 mg total) by mouth every 6 (six) hours as needed. 12/30/16   Kirichenko, Lemont Fillers, PA-C  levofloxacin (LEVAQUIN) 750 MG tablet Take 1 tablet (750 mg total) by mouth every other day. 02/13/18   Derwood Kaplan, MD    Family History History reviewed. No pertinent family history.  Social History Social History   Tobacco Use  . Smoking status: Never Smoker  . Smokeless tobacco: Never Used  Substance Use Topics  . Alcohol use: Yes    Comment: PT last drank beer on Friday 4-27  . Drug use: No    Comment: in rehab     Allergies   Patient has no known allergies.   Review of Systems Review of Systems  Constitutional: Negative for activity change.  Respiratory: Positive for cough and shortness of breath.   Cardiovascular: Positive for chest pain.  All other systems reviewed and are negative.  Physical Exam Updated Vital Signs BP 103/73 (BP Location: Right Arm)   Pulse 98   Temp 98.1 F (36.7 C) (Oral)   Resp 17   Ht 6\' 5"  (1.956 m)   Wt 127 kg (280 lb)   SpO2 100%   BMI 33.20 kg/m   Physical Exam  Constitutional: He is oriented to person, place, and time. He appears well-developed.  HENT:  Head: Atraumatic.  Neck: Neck supple.  Cardiovascular: Normal rate.  Pulmonary/Chest: Effort normal. He has rales.  Right lower lung field rhonchi  Neurological: He is alert and oriented to person, place, and time.  Skin: Skin is warm.  Nursing note and vitals reviewed.    ED Treatments / Results    Labs (all labs ordered are listed, but only abnormal results are displayed) Labs Reviewed  COMPREHENSIVE METABOLIC PANEL - Abnormal; Notable for the following components:      Result Value   Sodium 134 (*)    Potassium 3.2 (*)    Chloride 97 (*)    CO2 21 (*)    Glucose, Bld 158 (*)    BUN 26 (*)    Creatinine, Ser 1.82 (*)    Calcium 8.7 (*)    Total Protein 8.2 (*)    Albumin 2.8 (*)    Total Bilirubin 1.3 (*)    GFR calc non Af Amer 37 (*)    GFR calc Af Amer 43 (*)    Anion gap 16 (*)    All other components within normal limits  CBC WITH DIFFERENTIAL/PLATELET - Abnormal; Notable for the following components:   WBC 12.1 (*)    HCT 38.7 (*)    Neutro Abs 9.7 (*)    Monocytes Absolute 1.6 (*)    All other components within normal limits  URINALYSIS, ROUTINE W REFLEX MICROSCOPIC - Abnormal; Notable for the following components:   Color, Urine AMBER (*)    APPearance CLOUDY (*)    Hgb urine dipstick SMALL (*)    Bilirubin Urine SMALL (*)    Protein, ur 100 (*)    All other components within normal limits  URINALYSIS, MICROSCOPIC (REFLEX) - Abnormal; Notable for the following components:   Bacteria, UA RARE (*)    Squamous Epithelial / LPF 0-5 (*)    All other components within normal limits  BRAIN NATRIURETIC PEPTIDE    EKG  EKG Interpretation None       Radiology Dg Chest 2 View  Result Date: 02/11/2018 EXAM: CHEST  2 VIEW COMPARISON:  None. FINDINGS: Normal cardiac silhouette with ectatic aorta. There is nodular airspace disease in the RIGHT lower lobe. LEFT lung is clear. No pneumothorax. No acute osseous abnormality. IMPRESSION: RIGHT lower lobe pneumonia. Followup PA and lateral chest X-ray is recommended in 3-4 weeks following trial of antibiotic therapy to ensure resolution and exclude underlying malignancy. Electronically Signed   By: Genevive BiStewart  Edmunds M.D.   On: 02/11/2018 09:07    Procedures Procedures (including critical care time)  Medications  Ordered in ED Medications  levofloxacin (LEVAQUIN) tablet 750 mg (750 mg Oral Given 02/11/18 1227)     Initial Impression / Assessment and Plan / ED Course  I have reviewed the triage vital signs and the nursing notes.  Pertinent labs & imaging results that were available during my care of the patient were reviewed by me and considered in my medical decision making (see chart for details).     67 year old male comes in with chief complaint of cough.  Patient  is having right-sided rhonchi, and the x-ray of the lungs show that patient is having a pneumonia.  Patient is also complaining of discolored urine, he is having hematuria, with several WBCs in the urine.  Patient denies any penile discharge, rash over the GU region, and denies any history of STDs.  Patient does not have a PCP.  Compared to his 2016 creatinine his creatinine today is elevated.  We will start patient on Levaquin to cover both the pneumonia and possible UTI.  Patient also has been advised to follow-up with a primary care doctor and some contact information have been provided.  Strict ER return precautions have been discussed, and patient is agreeing with the plan and is comfortable with the workup done and the recommendations from the ER.   Final Clinical Impressions(s) / ED Diagnoses   Final diagnoses:  Community acquired pneumonia of right lower lobe of lung (HCC)  Elevated creatine kinase  Cystitis  Other microscopic hematuria    ED Discharge Orders        Ordered    levofloxacin (LEVAQUIN) 750 MG tablet  Every 48 hours     02/11/18 1209       Derwood Kaplan, MD 02/11/18 1529

## 2018-02-11 NOTE — ED Triage Notes (Signed)
Pt states he feels like he has had cough and vomiting for 4 days. Pt states he has been unable to keep food down. He reports his urine has been dark as well. Pt states feeling like he has a fever due to sweats this morning but noted to be afebrile in triage.
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
## 2018-06-12 IMAGING — CR DG CHEST 2V
2 series · 2 of 2 positions shown · non-contrast
Comparison: None.

EXAM:
CHEST  2 VIEW

[chest pa]
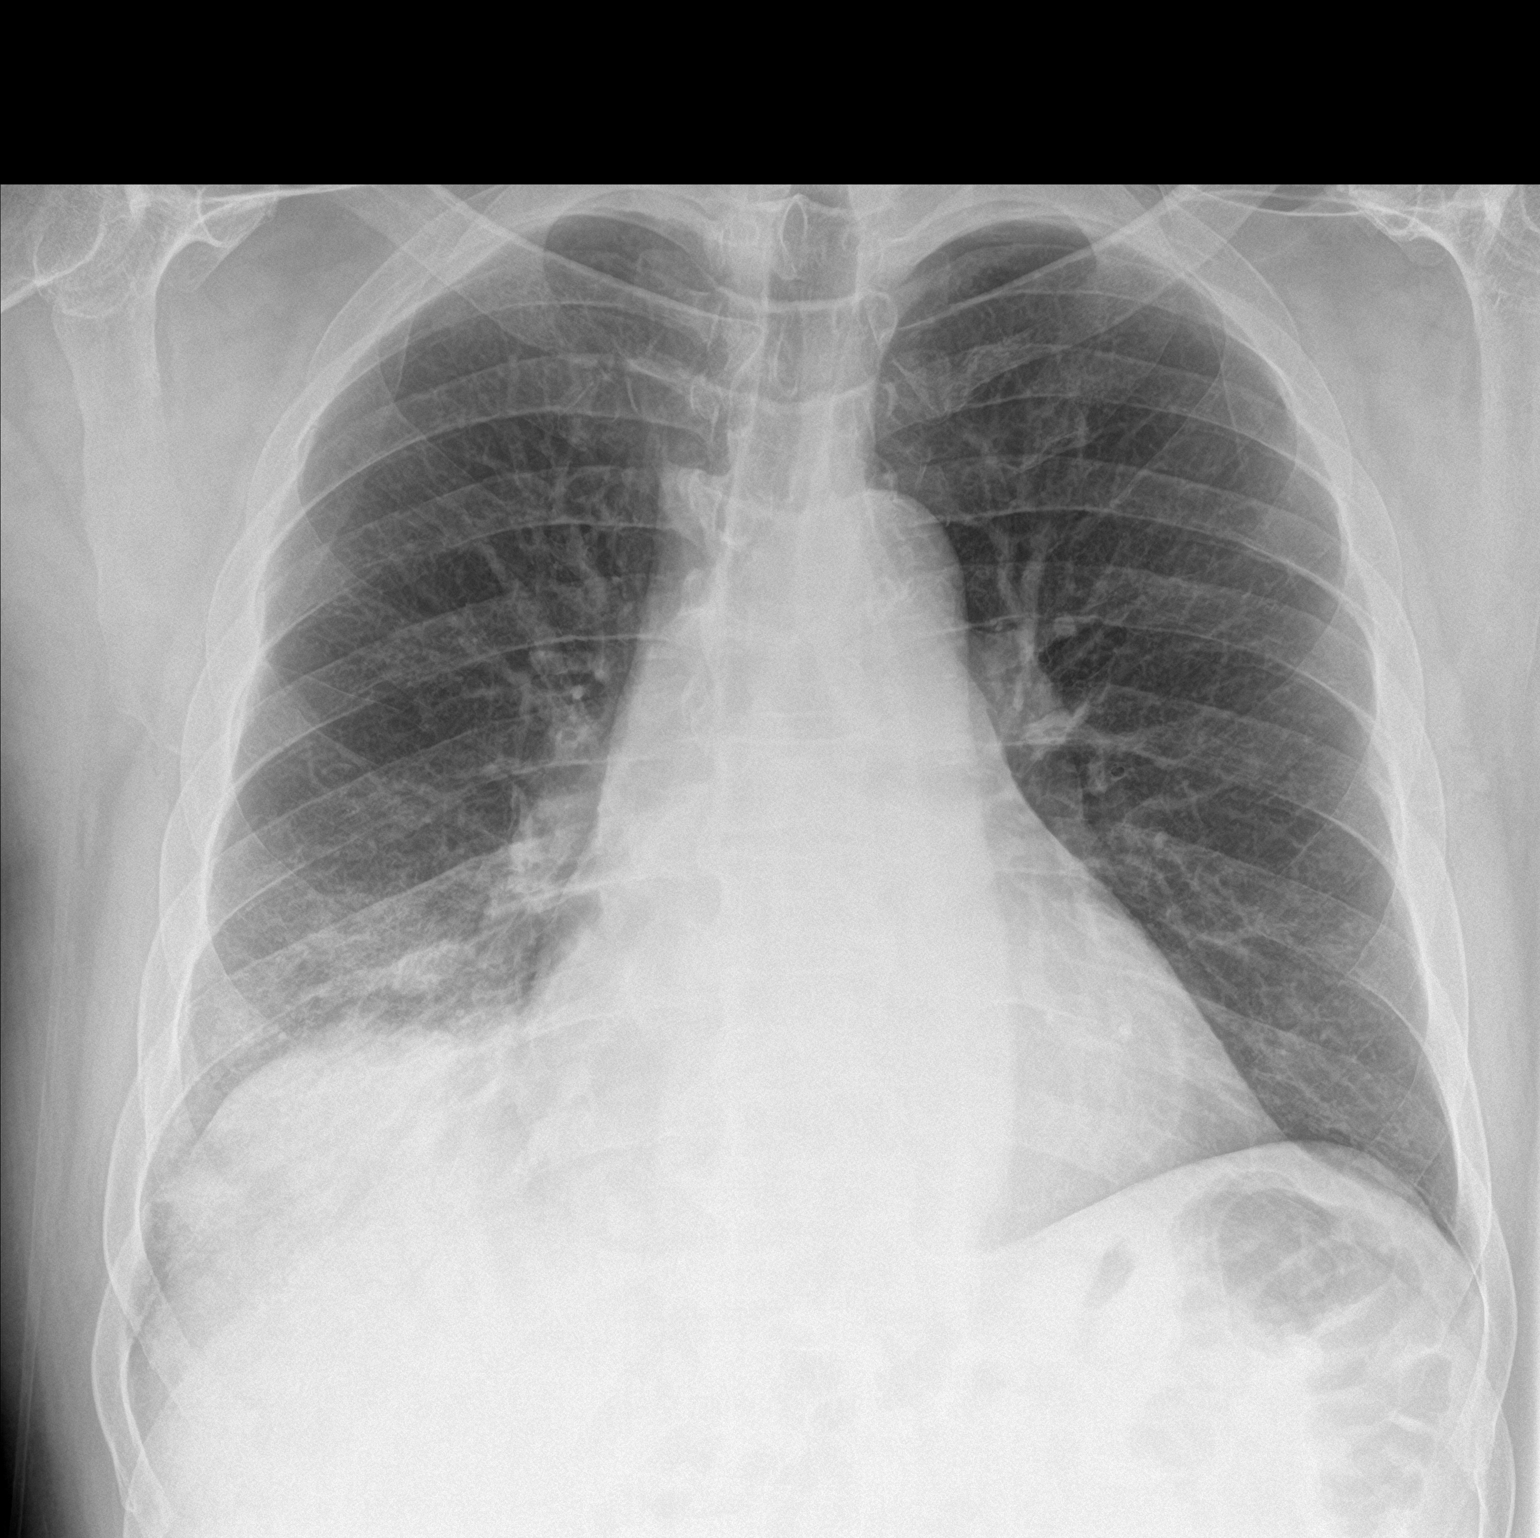

[chest lat]
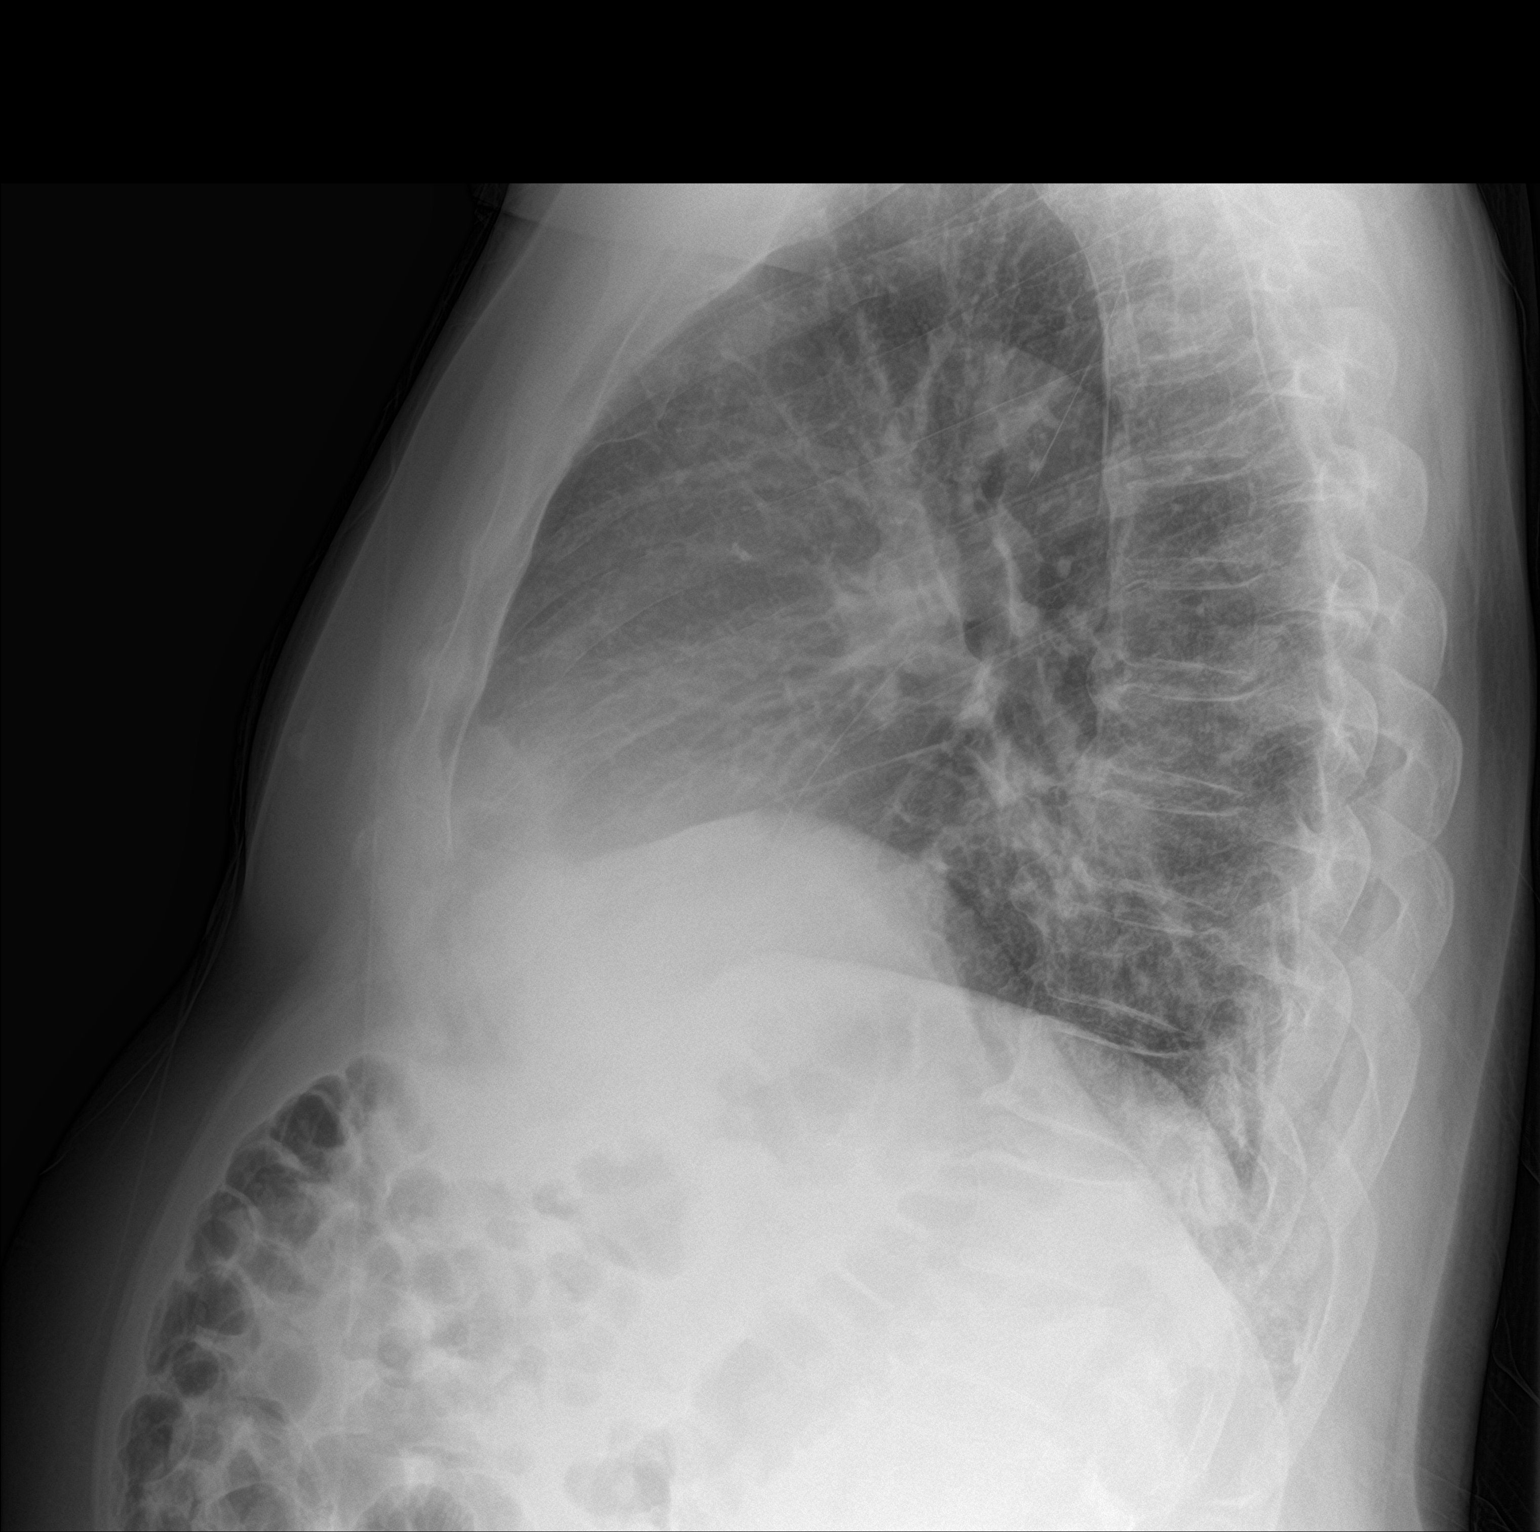

[2 of 2 positions shown; findings below may reference images not displayed]

FINDINGS: Normal cardiac silhouette with ectatic aorta. There is nodular
airspace disease in the RIGHT lower lobe. LEFT lung is clear.. No
pneumothorax. No acute osseous abnormality.
IMPRESSION: RIGHT lower lobe pneumonia. Followup PA and lateral chest X-ray is
recommended in 3-4 weeks following trial of antibiotic therapy to
ensure resolution and exclude underlying malignancy.

## 2018-09-02 ENCOUNTER — Ambulatory Visit (INDEPENDENT_AMBULATORY_CARE_PROVIDER_SITE_OTHER): Payer: Medicare Other | Admitting: Physician Assistant
